# Patient Record
Sex: Female | Born: 2000 | Race: Black or African American | Hispanic: No | Smoking: Never smoker
Health system: Southern US, Community
[De-identification: ages and names within clinical notes are randomized; demographics above are authoritative.]

## PROBLEM LIST (undated history)

## (undated) HISTORY — PX: CHOLECYSTECTOMY: SHX55

---

## 2021-04-21 ENCOUNTER — Emergency Department: Payer: Self-pay

## 2021-04-21 ENCOUNTER — Encounter: Payer: Self-pay | Admitting: Emergency Medicine

## 2021-04-21 ENCOUNTER — Other Ambulatory Visit: Payer: Self-pay

## 2021-04-21 ENCOUNTER — Emergency Department
Admission: EM | Admit: 2021-04-21 | Discharge: 2021-04-21 | Disposition: A | Discharge status: Home / Self Care | Payer: Self-pay | Attending: Emergency Medicine | Admitting: Emergency Medicine

## 2021-04-21 DIAGNOSIS — R1010 Upper abdominal pain, unspecified: Secondary | ICD-10-CM

## 2021-04-21 DIAGNOSIS — R112 Nausea with vomiting, unspecified: Secondary | ICD-10-CM | POA: Insufficient documentation

## 2021-04-21 DIAGNOSIS — F1090 Alcohol use, unspecified, uncomplicated: Secondary | ICD-10-CM | POA: Insufficient documentation

## 2021-04-21 DIAGNOSIS — R1011 Right upper quadrant pain: Secondary | ICD-10-CM | POA: Insufficient documentation

## 2021-04-21 DIAGNOSIS — R1013 Epigastric pain: Secondary | ICD-10-CM | POA: Insufficient documentation

## 2021-04-21 DIAGNOSIS — K2970 Gastritis, unspecified, without bleeding: Secondary | ICD-10-CM

## 2021-04-21 DIAGNOSIS — R1012 Left upper quadrant pain: Secondary | ICD-10-CM | POA: Insufficient documentation

## 2021-04-21 LAB — URINALYSIS, ROUTINE W REFLEX MICROSCOPIC
Bilirubin Urine: NEGATIVE
Glucose, UA: NEGATIVE mg/dL
Hgb urine dipstick: NEGATIVE
Ketones, ur: 80 mg/dL — AB
Leukocytes,Ua: NEGATIVE
Nitrite: NEGATIVE
Protein, ur: NEGATIVE mg/dL
Specific Gravity, Urine: 1.027 (ref 1.005–1.030)
pH: 5 (ref 5.0–8.0)

## 2021-04-21 LAB — CBC
HCT: 39.8 % (ref 36.0–46.0)
Hemoglobin: 12.3 g/dL (ref 12.0–15.0)
MCH: 21 pg — ABNORMAL LOW (ref 26.0–34.0)
MCHC: 30.9 g/dL (ref 30.0–36.0)
MCV: 67.8 fL — ABNORMAL LOW (ref 80.0–100.0)
Platelets: 385 10*3/uL (ref 150–400)
RBC: 5.87 MIL/uL — ABNORMAL HIGH (ref 3.87–5.11)
RDW: 14.9 % (ref 11.5–15.5)
WBC: 8.7 10*3/uL (ref 4.0–10.5)
nRBC: 0.2 % (ref 0.0–0.2)

## 2021-04-21 LAB — COMPREHENSIVE METABOLIC PANEL
ALT: 21 U/L (ref 0–44)
AST: 26 U/L (ref 15–41)
Albumin: 4.6 g/dL (ref 3.5–5.0)
Alkaline Phosphatase: 73 U/L (ref 38–126)
Anion gap: 10 (ref 5–15)
BUN: 7 mg/dL (ref 6–20)
CO2: 26 mmol/L (ref 22–32)
Calcium: 9.7 mg/dL (ref 8.9–10.3)
Chloride: 102 mmol/L (ref 98–111)
Creatinine, Ser: 0.72 mg/dL (ref 0.44–1.00)
GFR, Estimated: >60 mL/min (ref >60)
Glucose, Bld: 122 mg/dL — ABNORMAL HIGH (ref 70–99)
Potassium: 3.8 mmol/L (ref 3.5–5.1)
Sodium: 138 mmol/L (ref 135–145)
Total Bilirubin: 0.4 mg/dL (ref 0.3–1.2)
Total Protein: 8.3 g/dL — ABNORMAL HIGH (ref 6.5–8.1)

## 2021-04-21 LAB — LIPASE, BLOOD: Lipase: 25 U/L (ref 11–51)

## 2021-04-21 LAB — HCG, QUANTITATIVE, PREGNANCY: hCG, Beta Chain, Quant, S: <1 m[IU]/mL (ref <5)

## 2021-04-21 LAB — POC URINE PREG, ED: Preg Test, Ur: NEGATIVE

## 2021-04-21 MED ORDER — ALUM & MAG HYDROXIDE-SIMETH 200-200-20 MG/5ML PO SUSP
30.0000 mL | Freq: Once | ORAL | Status: AC
Start: 2021-04-21 — End: 2021-04-21
  Administered 2021-04-21: 30 mL via ORAL
  Filled 2021-04-21: qty 30

## 2021-04-21 MED ORDER — ONDANSETRON HCL 4 MG/2ML IJ SOLN
4.0000 mg | Freq: Once | INTRAMUSCULAR | Status: AC
Start: 2021-04-21 — End: 2021-04-21
  Administered 2021-04-21: 4 mg via INTRAVENOUS
  Filled 2021-04-21: qty 2

## 2021-04-21 MED ORDER — ONDANSETRON 4 MG PO TBDP: 4.0000 mg | ORAL_TABLET | Freq: Three times a day (TID) | ORAL | 0 refills | Status: AC | PRN

## 2021-04-21 MED ORDER — LIDOCAINE VISCOUS HCL 2 % MT SOLN
15.0000 mL | Freq: Once | OROMUCOSAL | Status: AC
Start: 2021-04-21 — End: 2021-04-21
  Administered 2021-04-21: 15 mL via ORAL
  Filled 2021-04-21: qty 15

## 2021-04-21 MED ORDER — HYDROMORPHONE HCL 1 MG/ML IJ SOLN
0.5000 mg | Freq: Once | INTRAMUSCULAR | Status: AC
  Administered 2021-04-21: 0.5 mg via INTRAVENOUS
  Filled 2021-04-21: qty 1

## 2021-04-21 MED ORDER — IOHEXOL 300 MG/ML  SOLN
100.0000 mL | Freq: Once | INTRAMUSCULAR | Status: AC | PRN
Start: 2021-04-21 — End: 2021-04-21
  Administered 2021-04-21: 100 mL via INTRAVENOUS
  Filled 2021-04-21: qty 100

## 2021-04-21 MED ORDER — HYDROMORPHONE HCL 1 MG/ML IJ SOLN
1.0000 mg | Freq: Once | INTRAMUSCULAR | Status: AC
  Administered 2021-04-21: 1 mg via INTRAVENOUS
  Filled 2021-04-21: qty 1

## 2021-04-21 MED ORDER — DROPERIDOL 2.5 MG/ML IJ SOLN
1.2500 mg | Freq: Once | INTRAMUSCULAR | Status: AC
  Administered 2021-04-21: 1.25 mg via INTRAVENOUS
  Filled 2021-04-21: qty 2

## 2021-04-21 MED ORDER — PANTOPRAZOLE SODIUM 40 MG PO TBEC: 40.0000 mg | DELAYED_RELEASE_TABLET | Freq: Every day | ORAL | 0 refills | Status: DC

## 2021-04-21 MED ORDER — PANTOPRAZOLE SODIUM 40 MG IV SOLR
40.0000 mg | Freq: Once | INTRAVENOUS | Status: AC
Start: 2021-04-21 — End: 2021-04-21
  Administered 2021-04-21: 40 mg via INTRAVENOUS
  Filled 2021-04-21: qty 10

## 2021-04-21 MED ORDER — MORPHINE SULFATE (PF) 4 MG/ML IV SOLN
4.0000 mg | Freq: Once | INTRAVENOUS | Status: AC
  Administered 2021-04-21: 4 mg via INTRAVENOUS
  Filled 2021-04-21: qty 1

## 2021-04-21 MED ORDER — SUCRALFATE 1 G PO TABS: 1.0000 g | ORAL_TABLET | Freq: Three times a day (TID) | ORAL | 0 refills | Status: DC

## 2021-04-21 NOTE — ED Notes (Signed)
Pt leaving for CT.  

## 2021-04-21 NOTE — ED Notes (Signed)
Pt up to restroom; pt attempting to provide urine sample. Pt steady to restroom. Pt requesting pain meds again; provider [facility] notified via secure chat.  ?

## 2021-04-21 NOTE — ED Notes (Signed)
EDP Funke to bedside.  ?

## 2021-04-21 NOTE — ED Provider Notes (Signed)
6:08 PM There is no evidence of intestinal obstruction or pneumoperitoneum. ?There is no hydronephrosis. ?  ?There is mild diffuse wall thickening in the right colon which may ?be due to incomplete distention or suggest nonspecific colitis. ?  ?There are few small calcific densities in gallbladder suggesting ?gallbladder stones and possibly calcification in the wall of ?gallbladder. ?  ?Small amount of free fluid in the pelvis may suggest recent rupture ?of ovarian cyst or follicle. ?  ?Other findings as described in the body of the report. ? ? ?I did personally review the CT and see the gallbladder remnant.  ? ?Personally reviewed the patient she still complaining of severe pain.  Seems more upper abdomen we will add an ultrasound to evaluate her gallbladder although she is adamant that she had her gallbladder removed in [place] in 2016.  She states that when she was pregnant they thought that that she had a gallbladder as well but that they found out it was just a remnant.  She really does not have any lower abdominal pain but given the concern for cyst and free fluid will get ultrasound to further evaluate to see if she could be having referred pain from her ovaries. ? ?Her pelvic ultrasound was normal without evidence of torsion.  Her ultrasound of her right upper quadrant shows prior cholecystectomy with a 1.2 cm gallstone and a prominent remnant cystic duct.  I discussed the case with Dr. Maia Plan from surgery and he states that this should not be causing her the pain unless it was to move to her CBD GI also would not be able to do any kind of ERCP to remove it.  If her gallbladder is already removed should not become tracting against the cystic duct and there should be not be any obstruction so they sometimes just leave the stones in the ducts. ? ?9:15 PM  ? ?Reevaluated patient.  Urine preg was negative.  Urine with some ketones but is not tolerating some water.  Patient feeling much better at this time.  She  feels comfortable with discharge home with PPI, Carafate and will follow-up with GI given I suspect gastritis.  She expressed understanding felt comfortable with that plan ? ? ? ?  ?Concha Se, MD ?04/21/21 2320 ? ?

## 2021-04-21 NOTE — ED Notes (Signed)
Pt reports pain unchanged since dilaudid given about 40 minutes ago. Pt requesting to try something else. Visitor remains at bedside. EDP Siadecki notified via secure chat. ?

## 2021-04-21 NOTE — ED Notes (Signed)
Pt leaving for US.

## 2021-04-21 NOTE — ED Triage Notes (Signed)
Pt states that she has been having pain that wraps around her mid section bilat, pt states initially her pain reminded her of her gallbladder when she had it removed, but states that it is much worse now, reports some nausea and vomiting ?

## 2021-04-21 NOTE — ED Notes (Signed)
Urine preg NEG. 

## 2021-04-21 NOTE — ED Notes (Signed)
Pain meds given   family with pt.  Pt alert.   ?

## 2021-04-21 NOTE — ED Notes (Signed)
Pt sitting calmly on stretcher; pt's body language relaxed; face relaxed; pt currently reports 7/10 pain; states "my pain level is going down".  ?

## 2021-04-21 NOTE — ED Notes (Addendum)
See triage note. Pt is currently grimacing and pacing around room while bending over at waste with hands pressed into abdomen. Pt reports hasn't been able to keep down fluids or food in about 3 days. Very little urination and no BM in about 3 days per pt. Pt's resp reg/unlabored; skin dry. Pt reminded urine sample needed when she is able; pt has urine specimen cup.  ?

## 2021-04-21 NOTE — ED Provider Notes (Signed)
? ?[facility] ?Provider Note ? ? ? Event Date/Time  ? First MD Initiated Contact with Patient 04/21/21 1252   ?  (approximate) ? ? ?History  ? ?Abdominal Pain, Nausea, and Emesis ? ? ?HPI ? ?Darlene Nguyen is a 21 y.o. female status post cholecystectomy who presents with bilateral upper abdominal pain over the last few days, persistent course, but coming in more severe waves, not precipitated by eating or drinking.  It is associated with nausea and vomiting but no diarrhea.  The patient denies any vaginal bleeding or discharge and has slightly dark urine but no other urinary symptoms.  She states the pain feels somewhat similar to when she had gallbladder attacks previously, but is radiating to her back.  She reports some alcohol use but states she has not been drinking heavily anytime recently. ? ? ? ?Physical Exam  ? ?Triage Vital Signs: ?ED Triage Vitals  ?Enc Vitals Group  ?   BP 04/21/21 1222 138/75  ?   Pulse Rate 04/21/21 1222 69  ?   Resp 04/21/21 1222 16  ?   Temp 04/21/21 1222 98.7 ?F (37.1 ?C)  ?   Temp Source 04/21/21 1222 Oral  ?   SpO2 04/21/21 1222 94 %  ?   Weight 04/21/21 1225 188 lb (85.3 kg)  ?   Height 04/21/21 1225 5\' 1"  (1.549 m)  ?   Head Circumference --   ?   Peak Flow --   ?   Pain Score 04/21/21 1224 10  ?   Pain Loc --   ?   Pain Edu? --   ?   Excl. in GC? --   ? ? ?Most recent vital signs: ?Vitals:  ? 04/21/21 1222  ?BP: 138/75  ?Pulse: 69  ?Resp: 16  ?Temp: 98.7 ?F (37.1 ?C)  ?SpO2: 94%  ? ? ? ?General: Alert and oriented, uncomfortable appearing but in no acute distress. ?CV:  Good peripheral perfusion.  ?Resp:  Normal effort.  ?Abd:  Soft with moderate epigastric tenderness.  No distention.  ?Other:  No scleral icterus.  Moist mucous membranes. ? ? ?ED Results / Procedures / Treatments  ? ?Labs ?(all labs ordered are listed, but only abnormal results are displayed) ?Labs Reviewed  ?COMPREHENSIVE METABOLIC PANEL - Abnormal; Notable for the following components:  ?     Result Value  ? Glucose, Bld 122 (*)   ? Total Protein 8.3 (*)   ? All other components within normal limits  ?CBC - Abnormal; Notable for the following components:  ? RBC 5.87 (*)   ? MCV 67.8 (*)   ? MCH 21.0 (*)   ? All other components within normal limits  ?LIPASE, BLOOD  ?URINALYSIS, ROUTINE W REFLEX MICROSCOPIC  ?POC URINE PREG, ED  ? ? ? ?EKG ? ? ? ?RADIOLOGY ? ?CT abdomen/pelvis: Pending ? ?PROCEDURES: ? ?Critical Care performed: No ? ?Procedures ? ? ?MEDICATIONS ORDERED IN ED: ?Medications  ?HYDROmorphone (DILAUDID) injection 1 mg (1 mg Intravenous Given 04/21/21 1332)  ?ondansetron (ZOFRAN) injection 4 mg (4 mg Intravenous Given 04/21/21 1332)  ?HYDROmorphone (DILAUDID) injection 0.5 mg (0.5 mg Intravenous Given 04/21/21 1433)  ? ? ? ?IMPRESSION / MDM / ASSESSMENT AND PLAN / ED COURSE  ?I reviewed the triage vital signs and the nursing notes. ? ?21 year old female with PMH as noted above presents with bilateral upper abdominal pain over the last few days associated with nausea and vomiting. ? ?On initial exam the patient was very uncomfortable  appearing but in no acute distress.  The abdomen is soft with some epigastric tenderness but no other acute findings.  After receiving a dose of Dilaudid the patient appears much more comfortable. ? ?Differential includes gastritis, gastroparesis, pancreatitis, colitis, gastroenteritis, ureteral stone.  I have a relatively lower suspicion for choledocholithiasis or other hepatobiliary cause.  Given that the pain is only involving her upper abdomen I do not suspect ruptured ovarian cyst, torsion, or other gynecologic etiology. ? ?We will obtain lab work-up, CT abdomen/pelvis, and reassess. ? ?----------------------------------------- ?3:06 PM on 04/21/2021 ?----------------------------------------- ? ?Lab work-up is reassuring.  There is no leukocytosis.  LFTs and lipase are normal.  I have signed the patient out to the oncoming ED physician Dr. Fuller Plan who will  follow-up on the CT. ? ?FINAL CLINICAL IMPRESSION(S) / ED DIAGNOSES  ? ?Final diagnoses:  ?Upper abdominal pain  ? ? ? ?Rx / DC Orders  ? ?ED Discharge Orders   ? ? None  ? ?  ? ? ? ?Note:  This document was prepared using Dragon voice recognition software and may include unintentional dictation errors.  ?  Dionne Bucy, MD ?04/21/21 1506 ? ?

## 2021-04-21 NOTE — ED Notes (Signed)
EDP Siadecki notified via secure chat of pt's extreme pain.  ?

## 2021-04-21 NOTE — Discharge Instructions (Signed)
Your work-up was reassuring is that this could be some gastritis however you need to call the GI team for follow-up and return to the ER if symptoms are worsening develop fevers or any other concern ?

## 2022-01-20 ENCOUNTER — Other Ambulatory Visit: Payer: Self-pay

## 2022-01-20 ENCOUNTER — Emergency Department
Admission: EM | Admit: 2022-01-20 | Discharge: 2022-01-20 | Disposition: A | Discharge status: Home / Self Care | Payer: Medicaid Other | Attending: Emergency Medicine | Admitting: Emergency Medicine

## 2022-01-20 ENCOUNTER — Emergency Department: Payer: Medicaid Other

## 2022-01-20 DIAGNOSIS — S0990XA Unspecified injury of head, initial encounter: Secondary | ICD-10-CM | POA: Insufficient documentation

## 2022-01-20 DIAGNOSIS — Y92410 Unspecified street and highway as the place of occurrence of the external cause: Secondary | ICD-10-CM | POA: Insufficient documentation

## 2022-01-20 MED ORDER — MELOXICAM 15 MG PO TABS: 15.0000 mg | ORAL_TABLET | Freq: Every day | ORAL | 1 refills | Status: AC

## 2022-01-20 MED ORDER — MELOXICAM 15 MG PO TABS: 15.0000 mg | ORAL_TABLET | Freq: Every day | ORAL | Status: AC

## 2022-01-20 MED ORDER — MELOXICAM 15 MG PO TABS: 15.0000 mg | ORAL_TABLET | Freq: Every day | ORAL | Status: DC

## 2022-01-20 MED ORDER — METHOCARBAMOL 500 MG PO TABS: 500.0000 mg | ORAL_TABLET | Freq: Three times a day (TID) | ORAL | 0 refills | Status: AC | PRN
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
## 2022-02-03 ENCOUNTER — Encounter: Payer: Self-pay | Admitting: Emergency Medicine

## 2022-02-03 ENCOUNTER — Other Ambulatory Visit: Payer: Self-pay

## 2022-02-03 DIAGNOSIS — E876 Hypokalemia: Secondary | ICD-10-CM | POA: Insufficient documentation

## 2022-02-03 DIAGNOSIS — R739 Hyperglycemia, unspecified: Secondary | ICD-10-CM | POA: Insufficient documentation

## 2022-02-03 DIAGNOSIS — D509 Iron deficiency anemia, unspecified: Secondary | ICD-10-CM | POA: Insufficient documentation

## 2022-02-03 DIAGNOSIS — F12188 Cannabis abuse with other cannabis-induced disorder: Secondary | ICD-10-CM | POA: Diagnosis not present

## 2022-02-03 DIAGNOSIS — K838 Other specified diseases of biliary tract: Secondary | ICD-10-CM | POA: Diagnosis not present

## 2022-02-03 DIAGNOSIS — K8020 Calculus of gallbladder without cholecystitis without obstruction: Secondary | ICD-10-CM | POA: Diagnosis not present

## 2022-02-03 DIAGNOSIS — R112 Nausea with vomiting, unspecified: Principal | ICD-10-CM | POA: Insufficient documentation

## 2022-02-03 DIAGNOSIS — M549 Dorsalgia, unspecified: Secondary | ICD-10-CM | POA: Diagnosis not present

## 2022-02-03 DIAGNOSIS — R931 Abnormal findings on diagnostic imaging of heart and coronary circulation: Secondary | ICD-10-CM | POA: Diagnosis not present

## 2022-02-03 DIAGNOSIS — R1013 Epigastric pain: Secondary | ICD-10-CM | POA: Diagnosis not present

## 2022-02-03 NOTE — ED Triage Notes (Addendum)
Pt arrived via POV with reports of upper abd pain epigastric region and back pain that started this evening around 7pm.  Pt reports vomiting denies any diarrhea.  Pt describes the pain as sharp and spasms.  PT states she does not have gall bladder was removed 5 years ago.

## 2022-02-04 ENCOUNTER — Observation Stay
Admission: EM | Admit: 2022-02-04 | Discharge: 2022-02-05 | Disposition: A | Discharge status: Home / Self Care | Payer: Medicaid Other | Attending: Internal Medicine | Admitting: Internal Medicine

## 2022-02-04 ENCOUNTER — Encounter: Payer: Self-pay | Admitting: Internal Medicine

## 2022-02-04 ENCOUNTER — Emergency Department: Payer: Medicaid Other

## 2022-02-04 DIAGNOSIS — R1013 Epigastric pain: Secondary | ICD-10-CM

## 2022-02-04 DIAGNOSIS — K8020 Calculus of gallbladder without cholecystitis without obstruction: Secondary | ICD-10-CM | POA: Diagnosis not present

## 2022-02-04 DIAGNOSIS — F1210 Cannabis abuse, uncomplicated: Secondary | ICD-10-CM | POA: Diagnosis present

## 2022-02-04 DIAGNOSIS — K838 Other specified diseases of biliary tract: Secondary | ICD-10-CM | POA: Diagnosis not present

## 2022-02-04 DIAGNOSIS — E669 Obesity, unspecified: Secondary | ICD-10-CM | POA: Diagnosis present

## 2022-02-04 DIAGNOSIS — D509 Iron deficiency anemia, unspecified: Secondary | ICD-10-CM | POA: Diagnosis present

## 2022-02-04 DIAGNOSIS — E66811 Obesity, class 1: Secondary | ICD-10-CM | POA: Diagnosis present

## 2022-02-04 DIAGNOSIS — R112 Nausea with vomiting, unspecified: Secondary | ICD-10-CM

## 2022-02-04 DIAGNOSIS — E876 Hypokalemia: Secondary | ICD-10-CM | POA: Diagnosis present

## 2022-02-04 DIAGNOSIS — R1116 Cannabis hyperemesis syndrome: Secondary | ICD-10-CM

## 2022-02-04 DIAGNOSIS — R739 Hyperglycemia, unspecified: Secondary | ICD-10-CM | POA: Diagnosis present

## 2022-02-04 DIAGNOSIS — R1110 Vomiting, unspecified: Secondary | ICD-10-CM | POA: Diagnosis present

## 2022-02-04 LAB — URINE DRUG SCREEN, QUALITATIVE (ARMC ONLY)
Amphetamines, Ur Screen: NOT DETECTED
Barbiturates, Ur Screen: NOT DETECTED
Benzodiazepine, Ur Scrn: NOT DETECTED
Cannabinoid 50 Ng, Ur ~~LOC~~: POSITIVE — AB
Cocaine Metabolite,Ur ~~LOC~~: NOT DETECTED
MDMA (Ecstasy)Ur Screen: NOT DETECTED
Methadone Scn, Ur: NOT DETECTED
Opiate, Ur Screen: POSITIVE — AB
Phencyclidine (PCP) Ur S: NOT DETECTED
Tricyclic, Ur Screen: NOT DETECTED

## 2022-02-04 LAB — COMPREHENSIVE METABOLIC PANEL
ALT: 22 U/L (ref 0–44)
AST: 28 U/L (ref 15–41)
Albumin: 4.5 g/dL (ref 3.5–5.0)
Alkaline Phosphatase: 70 U/L (ref 38–126)
Anion gap: 10 (ref 5–15)
BUN: 10 mg/dL (ref 6–20)
CO2: 24 mmol/L (ref 22–32)
Calcium: 8.9 mg/dL (ref 8.9–10.3)
Chloride: 102 mmol/L (ref 98–111)
Creatinine, Ser: 0.71 mg/dL (ref 0.44–1.00)
GFR, Estimated: >60 mL/min (ref >60)
Glucose, Bld: 154 mg/dL — ABNORMAL HIGH (ref 70–99)
Potassium: 3 mmol/L — ABNORMAL LOW (ref 3.5–5.1)
Sodium: 136 mmol/L (ref 135–145)
Total Bilirubin: 0.6 mg/dL (ref 0.3–1.2)
Total Protein: 8.1 g/dL (ref 6.5–8.1)

## 2022-02-04 LAB — CBC
HCT: 36 % (ref 36.0–46.0)
Hemoglobin: 11.2 g/dL — ABNORMAL LOW (ref 12.0–15.0)
MCH: 21.3 pg — ABNORMAL LOW (ref 26.0–34.0)
MCHC: 31.1 g/dL (ref 30.0–36.0)
MCV: 68.4 fL — ABNORMAL LOW (ref 80.0–100.0)
Platelets: 377 10*3/uL (ref 150–400)
RBC: 5.26 MIL/uL — ABNORMAL HIGH (ref 3.87–5.11)
RDW: 14.4 % (ref 11.5–15.5)
WBC: 13.5 10*3/uL — ABNORMAL HIGH (ref 4.0–10.5)
nRBC: 0 % (ref 0.0–0.2)

## 2022-02-04 LAB — LIPASE, BLOOD: Lipase: 26 U/L (ref 11–51)

## 2022-02-04 LAB — HCG, QUANTITATIVE, PREGNANCY: hCG, Beta Chain, Quant, S: <1 m[IU]/mL (ref <5)

## 2022-02-04 LAB — TROPONIN I (HIGH SENSITIVITY): Troponin I (High Sensitivity): <2 ng/L (ref <18)

## 2022-02-04 MED ORDER — HYDROMORPHONE HCL 1 MG/ML IJ SOLN: 0.5000 mg | INTRAMUSCULAR | Status: DC | PRN

## 2022-02-04 MED ORDER — METOCLOPRAMIDE HCL 5 MG/ML IJ SOLN
10.0000 mg | Freq: Four times a day (QID) | INTRAMUSCULAR | Status: DC
  Administered 2022-02-04 – 2022-02-05 (×4): 10 mg via INTRAVENOUS
  Filled 2022-02-04 (×4): qty 2

## 2022-02-04 MED ORDER — SODIUM CHLORIDE 0.9 % IV BOLUS
1000.0000 mL | Freq: Once | INTRAVENOUS | Status: AC
  Administered 2022-02-04: 1000 mL via INTRAVENOUS

## 2022-02-04 MED ORDER — HALOPERIDOL LACTATE 5 MG/ML IJ SOLN
5.0000 mg | Freq: Once | INTRAMUSCULAR | Status: AC
  Administered 2022-02-04: 5 mg via INTRAVENOUS
  Filled 2022-02-04: qty 1

## 2022-02-04 MED ORDER — ONDANSETRON HCL 4 MG/2ML IJ SOLN
4.0000 mg | Freq: Once | INTRAMUSCULAR | Status: AC | PRN
  Administered 2022-02-04: 4 mg via INTRAVENOUS
  Filled 2022-02-04: qty 2

## 2022-02-04 MED ORDER — HYDROMORPHONE HCL 1 MG/ML IJ SOLN
1.0000 mg | Freq: Once | INTRAMUSCULAR | Status: AC
  Administered 2022-02-04: 1 mg via INTRAVENOUS
  Filled 2022-02-04: qty 1

## 2022-02-04 MED ORDER — PANTOPRAZOLE SODIUM 40 MG IV SOLR
40.0000 mg | INTRAVENOUS | Status: DC
  Administered 2022-02-05: 40 mg via INTRAVENOUS
  Filled 2022-02-04 (×2): qty 10

## 2022-02-04 MED ORDER — PANTOPRAZOLE SODIUM 40 MG IV SOLR
40.0000 mg | Freq: Once | INTRAVENOUS | Status: AC
  Administered 2022-02-04: 40 mg via INTRAVENOUS
  Filled 2022-02-04: qty 10

## 2022-02-04 MED ORDER — POTASSIUM CHLORIDE IN NACL 20-0.9 MEQ/L-% IV SOLN
INTRAVENOUS | Status: AC
  Filled 2022-02-04 (×4): qty 1000

## 2022-02-04 MED ORDER — ONDANSETRON HCL 4 MG PO TABS: 4.0000 mg | ORAL_TABLET | Freq: Four times a day (QID) | ORAL | Status: DC | PRN

## 2022-02-04 MED ORDER — METOCLOPRAMIDE HCL 5 MG/ML IJ SOLN: 10.0000 mg | Freq: Four times a day (QID) | INTRAMUSCULAR | Status: DC | PRN

## 2022-02-04 MED ORDER — MORPHINE SULFATE (PF) 4 MG/ML IV SOLN
4.0000 mg | INTRAVENOUS | Status: DC | PRN
  Administered 2022-02-04 – 2022-02-05 (×5): 4 mg via INTRAVENOUS
  Filled 2022-02-04 (×5): qty 1

## 2022-02-04 MED ORDER — CAPSAICIN 0.025 % EX CREA
TOPICAL_CREAM | Freq: Once | CUTANEOUS | Status: AC
  Filled 2022-02-04: qty 60

## 2022-02-04 MED ORDER — FAMOTIDINE IN NACL 20-0.9 MG/50ML-% IV SOLN
20.0000 mg | Freq: Once | INTRAVENOUS | Status: AC
  Administered 2022-02-04: 20 mg via INTRAVENOUS
  Filled 2022-02-04: qty 50

## 2022-02-04 MED ORDER — POTASSIUM CHLORIDE IN NACL 40-0.9 MEQ/L-% IV SOLN
INTRAVENOUS | Status: DC
  Filled 2022-02-04: qty 1000

## 2022-02-04 MED ORDER — ONDANSETRON HCL 4 MG/2ML IJ SOLN: 4.0000 mg | Freq: Four times a day (QID) | INTRAMUSCULAR | Status: DC | PRN

## 2022-02-04 MED ORDER — ONDANSETRON HCL 4 MG/2ML IJ SOLN: 4.0000 mg | Freq: Three times a day (TID) | INTRAMUSCULAR | Status: DC | PRN

## 2022-02-04 MED ORDER — METOCLOPRAMIDE HCL 5 MG/ML IJ SOLN
5.0000 mg | Freq: Once | INTRAMUSCULAR | Status: AC
  Administered 2022-02-04: 5 mg via INTRAVENOUS
  Filled 2022-02-04: qty 2

## 2022-02-04 MED ORDER — ACETAMINOPHEN 325 MG PO TABS
650.0000 mg | ORAL_TABLET | Freq: Four times a day (QID) | ORAL | Status: DC | PRN
  Administered 2022-02-05: 650 mg via ORAL
  Filled 2022-02-04: qty 2

## 2022-02-04 MED ORDER — ACETAMINOPHEN 650 MG RE SUPP: 650.0000 mg | Freq: Four times a day (QID) | RECTAL | Status: DC | PRN

## 2022-02-04 MED ORDER — MAGNESIUM SULFATE 2 GM/50ML IV SOLN
2.0000 g | Freq: Once | INTRAVENOUS | Status: AC
  Administered 2022-02-04: 2 g via INTRAVENOUS
  Filled 2022-02-04: qty 50

## 2022-02-04 MED ORDER — IOHEXOL 300 MG/ML  SOLN
100.0000 mL | Freq: Once | INTRAMUSCULAR | Status: AC | PRN
  Administered 2022-02-04: 100 mL via INTRAVENOUS

## 2022-02-04 MED ORDER — DROPERIDOL 2.5 MG/ML IJ SOLN
2.5000 mg | Freq: Once | INTRAMUSCULAR | Status: AC
  Administered 2022-02-04: 2.5 mg via INTRAVENOUS
  Filled 2022-02-04: qty 2

## 2022-02-04 NOTE — ED Notes (Signed)
Called for transport to room.  
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
## 2022-02-04 NOTE — H&P (Signed)
History and Physical    Patient: Darlene Nguyen QAS:[id number] DOB: 06-03-2000 DOA: 02/04/2022 DOS: the patient was seen and examined on 02/04/2022 PCP: Pcp, No  Patient coming from: Home  Chief Complaint:  Chief Complaint  Patient presents with   Abdominal Pain   Back Pain   HPI: Darlene Nguyen is a 22 y.o. female with medical history significant of class I obesity, cannabis use, history of cholecystectomy with residual choledocholithiasis who presented to the emergency department with abdominal and back pain associated with numerous episodes of emesis.  She smokes alcohol "a joint" of cannabis daily.   No  diarrhea, constipation, melena or hematochezia.  No flank pain, dysuria, frequency or hematuria.No fever, chills or night sweats. No sore throat, rhinorrhea, dyspnea, wheezing or hemoptysis.  No chest pain, palpitations, diaphoresis, PND, orthopnea or pitting edema of the lower extremities.   No polyuria, polydipsia, polyphagia or blurred vision.  ED course: Initial vital signs were temperature 98.4 F, pulse 100, respirations 20, BP 118/62 mmHg O2 sat 96% on room air.  The patient received 1000 mL of normal saline bolus, Protonix 40 mg IVP, ondansetron 4 mg IVP, metoclopramide 5 mg IVP hydromorphone 1 mg IVP, hydromorphone 0.5 mg IVP, haloperidol 5 mg IVP, droperidol 2.5 mg IVP and Capsaisin cream applied to the umbilicus x 1.  Lab work: CBC showed a white count of 13.5, hemoglobin 11.2 g/dL with an MCV of 68.4 fL and platelets 377.  Lipase, troponin and hCG, beta chain normal.  CMP showed a potassium 3.0 mmol/L and glucose of 154 mg/dL.  Imaging: CT abdomen cholelithiasis with no CT finding of acute cholecystitis.  [place] RUQ there is a 48 structure noted in the region of the gallbladder.  With up to 12 mm stone within.  These changes correspond to the seen on recent CT examination.  Given prior cholecystectomy this likely represents some residual dilatation of the cystic duct.  No CBD dilatation.   No other focal abnormality.   Review of Systems: As mentioned in the history of present illness. All other systems reviewed and are negative. No past medical history on file. Past Surgical History:  Procedure Laterality Date   CHOLECYSTECTOMY     Social History:  reports that she has never smoked. She has never used smokeless tobacco. She reports that she does not currently use alcohol. She reports current drug use. Drug: Marijuana.  No Known Allergies  Denied family history on first-degree 78 and extended family members.  Prior to Admission medications   Medication Sig Start Date End Date Taking? Authorizing Provider  pantoprazole (PROTONIX) 40 MG tablet Take 1 tablet (40 mg total) by mouth daily for 14 days. 04/21/21 05/05/21  Vanessa [place], MD  sucralfate (CARAFATE) 1 g tablet Take 1 tablet (1 g total) by mouth 4 (four) times daily -  with meals and at bedtime for 14 days. 04/21/21 05/05/21  Vanessa [place], MD    Physical Exam: Vitals:   02/04/22 0053 02/04/22 0314 02/04/22 0330 02/04/22 0548  BP: 99/75 133/78 135/83   Pulse: 84 (!) 57 (!) 59   Resp: 20 12    Temp:    97.6 F (36.4 C)  TempSrc:    Oral  SpO2: 99% 98% 95%   Weight:      Height:       Physical Exam Vitals and nursing note reviewed.  Constitutional:      General: She is not in acute distress.    Appearance: She is obese.  HENT:  Head: Normocephalic.     Nose: No rhinorrhea.     Mouth/Throat:     Mouth: Mucous membranes are dry.  Eyes:     General: No scleral icterus.    Pupils: Pupils are equal, round, and reactive to light.  Neck:     Vascular: No JVD.  Cardiovascular:     Rate and Rhythm: Normal rate and regular rhythm.     Heart sounds: S1 normal and S2 normal.  Pulmonary:     Effort: Pulmonary effort is normal.  Abdominal:     General: Abdomen is protuberant. Bowel sounds are normal.     Palpations: Abdomen is soft.     Tenderness: There is abdominal tenderness in the epigastric area. There is  no right CVA tenderness, left CVA tenderness, guarding or rebound.  Musculoskeletal:     Cervical back: Neck supple.     Right lower leg: No edema.     Left lower leg: No edema.  Skin:    General: Skin is warm and dry.  Neurological:     General: No focal deficit present.     Mental Status: She is alert, oriented to person, place, and time and easily aroused.  Psychiatric:        Mood and Affect: Mood normal.        Behavior: Behavior normal.   Data Reviewed:  Results are pending, will review when available.  Assessment and Plan: Principal Problem:   Hyperemesis In the setting of:   Cannabis abuse Observation/MedSurg. Keep NPO for now. Trial of clear liquids once feeling better. Continue IV fluids. Analgesics as needed. Antiemetics as needed. Pantoprazole 40 mg IVP every 24 hours. Keep electrolytes optimized. Follow-up CBC and CMP in AM.  Active Problems:   Hypokalemia Replacing. Magnesium supplemented. Follow-up potassium level.    Microcytic anemia Likely due to menstrual losses. Might benefit from iron supplementation. Needs to establish with a primary care provider. Follow-up with GYN and a/or PCP as an outpatient    Hyperglycemia Check fasting glucose in AM.    Class I obesity Current BMI 33.07 kg/m. Lifestyle modifications.       Advance Care Planning:   Code Status: Full Code   Consults:   Family Communication:   Severity of Illness: The appropriate patient status for this patient is OBSERVATION. Observation status is judged to be reasonable and necessary in order to provide the required intensity of service to ensure the patient's safety. The patient's presenting symptoms, physical exam findings, and initial radiographic and laboratory data in the context of their medical condition is felt to place them at decreased risk for further clinical deterioration. Furthermore, it is anticipated that the patient will be medically stable for discharge  from the hospital within 2 midnights of admission.   Author: Reubin Milan, MD 02/04/2022 7:39 AM  For on call review [email or web address].   This document was prepared using Dragon voice recognition software and may contain some unintended transcription errors.

## 2022-02-04 NOTE — ED Notes (Signed)
Lab called to add HCG.

## 2022-02-04 NOTE — ED Notes (Signed)
Attempted IV access x2 both unsuccessful

## 2022-02-04 NOTE — ED Notes (Signed)
Pt unable to void 

## 2022-02-05 DIAGNOSIS — E876 Hypokalemia: Secondary | ICD-10-CM

## 2022-02-05 DIAGNOSIS — R1110 Vomiting, unspecified: Secondary | ICD-10-CM | POA: Diagnosis not present

## 2022-02-05 LAB — BASIC METABOLIC PANEL
Anion gap: 3 — ABNORMAL LOW (ref 5–15)
BUN: <5 mg/dL — ABNORMAL LOW (ref 6–20)
CO2: 25 mmol/L (ref 22–32)
Calcium: 8.7 mg/dL — ABNORMAL LOW (ref 8.9–10.3)
Chloride: 108 mmol/L (ref 98–111)
Creatinine, Ser: 0.64 mg/dL (ref 0.44–1.00)
GFR, Estimated: >60 mL/min (ref >60)
Glucose, Bld: 80 mg/dL (ref 70–99)
Potassium: 4.3 mmol/L (ref 3.5–5.1)
Sodium: 136 mmol/L (ref 135–145)

## 2022-02-05 LAB — CBC
HCT: 33.8 % — ABNORMAL LOW (ref 36.0–46.0)
Hemoglobin: 10.5 g/dL — ABNORMAL LOW (ref 12.0–15.0)
MCH: 21.3 pg — ABNORMAL LOW (ref 26.0–34.0)
MCHC: 31.1 g/dL (ref 30.0–36.0)
MCV: 68.7 fL — ABNORMAL LOW (ref 80.0–100.0)
Platelets: 320 10*3/uL (ref 150–400)
RBC: 4.92 MIL/uL (ref 3.87–5.11)
RDW: 14.7 % (ref 11.5–15.5)
WBC: 8.8 10*3/uL (ref 4.0–10.5)
nRBC: 0 % (ref 0.0–0.2)

## 2022-02-05 LAB — URINALYSIS, ROUTINE W REFLEX MICROSCOPIC
Bilirubin Urine: NEGATIVE
Glucose, UA: NEGATIVE mg/dL
Hgb urine dipstick: NEGATIVE
Ketones, ur: 20 mg/dL — AB
Leukocytes,Ua: NEGATIVE
Nitrite: NEGATIVE
Protein, ur: NEGATIVE mg/dL
Specific Gravity, Urine: 1.006 (ref 1.005–1.030)
pH: 7 (ref 5.0–8.0)

## 2022-02-05 LAB — PHOSPHORUS: Phosphorus: 3.5 mg/dL (ref 2.5–4.6)

## 2022-02-05 LAB — MAGNESIUM: Magnesium: 1.9 mg/dL (ref 1.7–2.4)

## 2022-02-05 LAB — HIV ANTIBODY (ROUTINE TESTING W REFLEX): HIV Screen 4th Generation wRfx: NONREACTIVE

## 2022-02-05 NOTE — Progress Notes (Signed)
Pt given all discharge instructions. PIV removed. Discharged home.
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
## 2023-01-19 ENCOUNTER — Other Ambulatory Visit: Payer: Self-pay

## 2023-01-19 ENCOUNTER — Emergency Department (HOSPITAL_COMMUNITY)
Admission: EM | Admit: 2023-01-19 | Discharge: 2023-01-19 | Disposition: A | Discharge status: Home / Self Care | Payer: 59 | Attending: Emergency Medicine | Admitting: Emergency Medicine

## 2023-01-19 ENCOUNTER — Encounter (HOSPITAL_COMMUNITY): Payer: Self-pay

## 2023-01-19 ENCOUNTER — Emergency Department (HOSPITAL_COMMUNITY): Payer: 59

## 2023-01-19 DIAGNOSIS — R1084 Generalized abdominal pain: Secondary | ICD-10-CM | POA: Diagnosis not present

## 2023-01-19 DIAGNOSIS — R1013 Epigastric pain: Secondary | ICD-10-CM | POA: Diagnosis not present

## 2023-01-19 DIAGNOSIS — R110 Nausea: Secondary | ICD-10-CM | POA: Insufficient documentation

## 2023-01-19 DIAGNOSIS — R109 Unspecified abdominal pain: Secondary | ICD-10-CM | POA: Diagnosis not present

## 2023-01-19 DIAGNOSIS — Z743 Need for continuous supervision: Secondary | ICD-10-CM | POA: Diagnosis not present

## 2023-01-19 DIAGNOSIS — R112 Nausea with vomiting, unspecified: Secondary | ICD-10-CM | POA: Diagnosis not present

## 2023-01-19 DIAGNOSIS — M549 Dorsalgia, unspecified: Secondary | ICD-10-CM | POA: Diagnosis not present

## 2023-01-19 LAB — COMPREHENSIVE METABOLIC PANEL
ALT: 22 U/L (ref 0–44)
AST: 29 U/L (ref 15–41)
Albumin: 4.7 g/dL (ref 3.5–5.0)
Alkaline Phosphatase: 61 U/L (ref 38–126)
Anion gap: 11 (ref 5–15)
BUN: 8 mg/dL (ref 6–20)
CO2: 23 mmol/L (ref 22–32)
Calcium: 9.4 mg/dL (ref 8.9–10.3)
Chloride: 103 mmol/L (ref 98–111)
Creatinine, Ser: 0.69 mg/dL (ref 0.44–1.00)
GFR, Estimated: >60 mL/min (ref >60)
Glucose, Bld: 118 mg/dL — ABNORMAL HIGH (ref 70–99)
Potassium: 3.4 mmol/L — ABNORMAL LOW (ref 3.5–5.1)
Sodium: 137 mmol/L (ref 135–145)
Total Bilirubin: 0.3 mg/dL (ref <1.2)
Total Protein: 8.2 g/dL — ABNORMAL HIGH (ref 6.5–8.1)

## 2023-01-19 LAB — HCG, SERUM, QUALITATIVE: Preg, Serum: NEGATIVE

## 2023-01-19 LAB — CBC
HCT: 36.7 % (ref 36.0–46.0)
Hemoglobin: 11.8 g/dL — ABNORMAL LOW (ref 12.0–15.0)
MCH: 22.3 pg — ABNORMAL LOW (ref 26.0–34.0)
MCHC: 32.2 g/dL (ref 30.0–36.0)
MCV: 69.2 fL — ABNORMAL LOW (ref 80.0–100.0)
Platelets: 370 10*3/uL (ref 150–400)
RBC: 5.3 MIL/uL — ABNORMAL HIGH (ref 3.87–5.11)
RDW: 15.1 % (ref 11.5–15.5)
WBC: 10.5 10*3/uL (ref 4.0–10.5)
nRBC: 0 % (ref 0.0–0.2)

## 2023-01-19 LAB — LIPASE, BLOOD: Lipase: 22 U/L (ref 11–51)

## 2023-01-19 MED ORDER — FENTANYL CITRATE PF 50 MCG/ML IJ SOSY
50.0000 ug | PREFILLED_SYRINGE | INTRAMUSCULAR | Status: DC | PRN
Start: 2023-01-19 — End: 2023-01-19
  Administered 2023-01-19: 50 ug via NASAL
  Filled 2023-01-19: qty 1

## 2023-01-19 MED ORDER — ONDANSETRON 4 MG PO TBDP: ORAL_TABLET | ORAL | 0 refills | Status: AC

## 2023-01-19 MED ORDER — PANTOPRAZOLE SODIUM 40 MG IV SOLR
40.0000 mg | Freq: Once | INTRAVENOUS | Status: DC
  Filled 2023-01-19: qty 10

## 2023-01-19 MED ORDER — HYDROMORPHONE HCL 1 MG/ML IJ SOLN
0.5000 mg | Freq: Once | INTRAMUSCULAR | Status: AC
  Administered 2023-01-19: 0.5 mg via INTRAVENOUS
  Filled 2023-01-19: qty 1

## 2023-01-19 MED ORDER — OXYCODONE-ACETAMINOPHEN 5-325 MG PO TABS: 1.0000 | ORAL_TABLET | Freq: Four times a day (QID) | ORAL | 0 refills | Status: AC | PRN

## 2023-01-19 MED ORDER — OXYCODONE-ACETAMINOPHEN 5-325 MG PO TABS
1.0000 | ORAL_TABLET | Freq: Once | ORAL | Status: AC
  Administered 2023-01-19: 1 via ORAL
  Filled 2023-01-19: qty 1

## 2023-01-19 MED ORDER — LORAZEPAM 2 MG/ML IJ SOLN
0.5000 mg | Freq: Once | INTRAMUSCULAR | Status: AC
  Administered 2023-01-19: 0.5 mg via INTRAVENOUS
  Filled 2023-01-19: qty 1

## 2023-01-19 MED ORDER — ALUM & MAG HYDROXIDE-SIMETH 200-200-20 MG/5ML PO SUSP
30.0000 mL | Freq: Once | ORAL | Status: AC
  Administered 2023-01-19: 30 mL via ORAL
  Filled 2023-01-19: qty 30

## 2023-01-19 MED ORDER — DICYCLOMINE HCL 10 MG PO CAPS
10.0000 mg | ORAL_CAPSULE | Freq: Once | ORAL | Status: AC
  Administered 2023-01-19: 10 mg via ORAL
  Filled 2023-01-19: qty 1

## 2023-01-19 MED ORDER — ONDANSETRON HCL 4 MG PO TABS: 4.0000 mg | ORAL_TABLET | Freq: Four times a day (QID) | ORAL | 0 refills | Status: AC

## 2023-01-19 MED ORDER — ONDANSETRON 4 MG PO TBDP
4.0000 mg | ORAL_TABLET | Freq: Once | ORAL | Status: AC
  Administered 2023-01-19: 4 mg via ORAL
  Filled 2023-01-19: qty 1

## 2023-01-19 MED ORDER — DROPERIDOL 2.5 MG/ML IJ SOLN
1.2500 mg | Freq: Once | INTRAMUSCULAR | Status: AC
  Administered 2023-01-19: 1.25 mg via INTRAVENOUS
  Filled 2023-01-19: qty 2

## 2023-01-19 MED ORDER — SUCRALFATE 1 G PO TABS
1.0000 g | ORAL_TABLET | Freq: Once | ORAL | Status: AC
  Administered 2023-01-19: 1 g via ORAL
  Filled 2023-01-19: qty 1

## 2023-01-19 MED ORDER — PROMETHAZINE HCL 25 MG RE SUPP: 25.0000 mg | Freq: Four times a day (QID) | RECTAL | 0 refills | Status: AC | PRN

## 2023-01-19 MED ORDER — ONDANSETRON HCL 4 MG/2ML IJ SOLN
4.0000 mg | Freq: Once | INTRAMUSCULAR | Status: DC
  Filled 2023-01-19: qty 2

## 2023-01-19 MED ORDER — PANTOPRAZOLE SODIUM 20 MG PO TBEC: 20.0000 mg | DELAYED_RELEASE_TABLET | Freq: Every day | ORAL | 0 refills | Status: AC

## 2023-01-19 NOTE — ED Provider Notes (Signed)
[name] Provider Note   CSN: [id number] Arrival date & time: 01/19/23  1259     History  Chief Complaint  Patient presents with   Abdominal Pain   Vomiting    Darlene Nguyen is a 22 y.o. female.  Patient complains of epigastric pain.  She states it severe and radiating to her back like it did when she got had a gallbladder removed.  Some nausea  The history is provided by the patient and medical records. No language interpreter was used.  Abdominal Pain Pain location:  Epigastric Pain quality: aching   Pain radiates to:  Does not radiate Pain severity:  Moderate Onset quality:  Sudden Timing:  Constant Progression:  Worsening Chronicity:  New Context: not alcohol use   Relieved by:  Nothing Worsened by:  Nothing Ineffective treatments:  None tried Associated symptoms: no chest pain, no cough, no diarrhea, no fatigue and no hematuria        Home Medications Prior to Admission medications   Not on File      Allergies    Patient has no known allergies.    Review of Systems   Review of Systems  Constitutional:  Negative for appetite change and fatigue.  HENT:  Negative for congestion, ear discharge and sinus pressure.   Eyes:  Negative for discharge.  Respiratory:  Negative for cough.   Cardiovascular:  Negative for chest pain.  Gastrointestinal:  Positive for abdominal pain. Negative for diarrhea.  Genitourinary:  Negative for frequency and hematuria.  Musculoskeletal:  Negative for back pain.  Skin:  Negative for rash.  Neurological:  Negative for seizures and headaches.  Psychiatric/Behavioral:  Negative for hallucinations.     Physical Exam Updated Vital Signs BP 133/84 (BP Location: Left Arm)   Pulse 70   Temp 98.2 F (36.8 C) (Oral)   Resp 18   Ht 5\' 1"  (1.549 m)   Wt 75.2 kg   LMP 12/20/2022 (Approximate)   SpO2 98%   BMI 31.32 kg/m  Physical Exam Vitals and nursing note reviewed.   Constitutional:      Appearance: She is well-developed.  HENT:     Head: Normocephalic.     Nose: Nose normal.  Eyes:     General: No scleral icterus.    Conjunctiva/sclera: Conjunctivae normal.  Neck:     Thyroid: No thyromegaly.  Cardiovascular:     Rate and Rhythm: Normal rate and regular rhythm.     Heart sounds: No murmur heard.    No friction rub. No gallop.  Pulmonary:     Breath sounds: No stridor. No wheezing or rales.  Chest:     Chest wall: No tenderness.  Abdominal:     General: There is no distension.     Tenderness: There is abdominal tenderness. There is no rebound.  Musculoskeletal:        General: Normal range of motion.     Cervical back: Neck supple.  Lymphadenopathy:     Cervical: No cervical adenopathy.  Skin:    Findings: No erythema or rash.  Neurological:     Mental Status: She is alert and oriented to person, place, and time.     Motor: No abnormal muscle tone.     Coordination: Coordination normal.  Psychiatric:        Behavior: Behavior normal.     ED Results / Procedures / Treatments   Labs (all labs ordered are listed, but only abnormal results are  displayed) Labs Reviewed  COMPREHENSIVE METABOLIC PANEL - Abnormal; Notable for the following components:      Result Value   Potassium 3.4 (*)    Glucose, Bld 118 (*)    Total Protein 8.2 (*)    All other components within normal limits  CBC - Abnormal; Notable for the following components:   RBC 5.30 (*)    Hemoglobin 11.8 (*)    MCV 69.2 (*)    MCH 22.3 (*)    All other components within normal limits  LIPASE, BLOOD  HCG, SERUM, QUALITATIVE  URINALYSIS, ROUTINE W REFLEX MICROSCOPIC    EKG None  Radiology No results found.  Procedures Procedures    Medications Ordered in ED Medications  pantoprazole (PROTONIX) injection 40 mg (has no administration in time range)  ondansetron (ZOFRAN) injection 4 mg (has no administration in time range)  ondansetron (ZOFRAN-ODT)  disintegrating tablet 4 mg (4 mg Oral Given 01/19/23 1321)  HYDROmorphone (DILAUDID) injection 0.5 mg (0.5 mg Intravenous Given 01/19/23 1424)  LORazepam (ATIVAN) injection 0.5 mg (0.5 mg Intravenous Given 01/19/23 1423)    ED Course/ Medical Decision Making/ A&P Clinical Course as of 01/19/23 1653  Wed Jan 19, 2023  1641 CT Renal Stone Study IMPRESSION: 1. No acute abdominopelvic process. 2. Retained gallstone at the gallbladder fossa, suspected within the residual cystic duct on prior ultrasound. No intra-or extrahepatic biliary duct ductal dilatation.   [TY]    Clinical Course User Index [TY] Coral Spikes, DO  Patient with severe epigastric and back pain.  Worse with palpitations.  Labs unremarkable.                               Medical Decision Making Amount and/or Complexity of Data Reviewed Labs: ordered. Radiology: ordered. Decision-making details documented in ED Course.  Risk Prescription drug management.   Epigastric abdominal pain.  Disposition will be determined by my colleague        Final Clinical Impression(s) / ED Diagnoses Final diagnoses:  None    Rx / DC Orders ED Discharge Orders     None         Bethann Berkshire, MD 01/19/23 1654

## 2023-01-19 NOTE — Discharge Instructions (Addendum)
Follow-up with your family doctor next week and also you have been referred to Dr. Ewing Schlein the gastroenterologist for follow-up in the next few weeks

## 2023-01-19 NOTE — ED Provider Notes (Signed)
22 year old female present emergency department for abdominal pain nausea vomiting.  Signed out with pending CT scan and reevaluation.  On reevaluation patient is still continuing of abdominal pain, nausea, but no vomiting.  Physical Exam  BP (!) 141/91 (BP Location: Left Arm)   Pulse (!) 59   Temp 98.6 F (37 C) (Oral)   Resp 18   Ht 5\' 1"  (1.549 m)   Wt 75.2 kg   LMP 12/20/2022 (Approximate)   SpO2 100%   BMI 31.32 kg/m   Physical Exam Vitals reviewed.  HENT:     Head: Normocephalic.  Cardiovascular:     Rate and Rhythm: Normal rate and regular rhythm.  Abdominal:     Palpations: Abdomen is soft.     Tenderness: There is no abdominal tenderness.  Skin:    General: Skin is warm and dry.     Capillary Refill: Capillary refill takes less than 2 seconds.  Neurological:     General: No focal deficit present.     Mental Status: She is alert.  Psychiatric:     Comments: On all fours on the bed, moaning.     Procedures  Procedures  ED Course / MDM   Clinical Course as of 01/19/23 1934  Wed Jan 19, 2023  1641 CT Renal Stone Study IMPRESSION: 1. No acute abdominopelvic process. 2. Retained gallstone at the gallbladder fossa, suspected within the residual cystic duct on prior ultrasound. No intra-or extrahepatic biliary duct ductal dilatation.   [TY]  1656 Chart review in January 2024  per d/c summary: "22 y.o. female with medical history significant of class I obesity, cannabis use, history of cholecystectomy with residual choledocholithiasis who presented to the emergency department with abdominal and back pain associated with numerous episodes of emesis." [TY]  1708 Patient is on all fours on the bed.  Moaning reporting of crampy abdominal pain and nausea.  No vomiting.  Endorsed marijuana use yesterday symptoms started at 3 AM.  Highly suspicious for cannabinoid hyperemesis syndrome.  Will treat with droperidol and reevaluate. [TY]    Clinical Course User Index [TY]  Coral Spikes, DO   Medical Decision Making Well-appearing 22 year old female presenting emergency department for abdominal pain.  Seen primarily by the morning doctor.  See their note for full HPI.  Signed out pending CT scan.  CT scan without acute pathology to explain patient's symptoms.  Reviewed labs.  No significant metabolic derangements.  Normal liver enzymes.  Lipase normal.  Pancreatitis unlikely.  No leukocytosis to suggest systemic infection.  Pregnancy test negative.  Ectopic pregnancy unlikely.  Patient did endorse THC use yesterday.  Question cannabinoid hyperemesis.  Given droperidol, GI cocktail and Percocet.  Symptoms controlled.  She is tolerating p.o. here in the emergency department.  Will discharge in stable condition.  Amount and/or Complexity of Data Reviewed Labs: ordered. Radiology: ordered. Decision-making details documented in ED Course.  Risk OTC drugs. Prescription drug management.         Coral Spikes, DO 01/19/23 Barry Brunner

## 2023-01-19 NOTE — ED Triage Notes (Signed)
Pt arrived with EMS reporting abdominal pain right side that started at 0300. Pain radiates to the lower back. Pt states pain feels like it did when she had her gallbladder removed. Denies any other symptoms.

## 2023-02-01 ENCOUNTER — Telehealth: Payer: Self-pay | Admitting: *Deleted

## 2023-02-01 NOTE — Progress Notes (Signed)
Transition Care Management Unsuccessful Follow-up Telephone Call  Date of discharge and from where:  [facility]  01/19/2023  Attempts:  1st Attempt  Reason for unsuccessful TCM follow-up call:  No answer/busy

## 2023-02-04 ENCOUNTER — Telehealth: Payer: Self-pay | Admitting: *Deleted

## 2023-02-04 NOTE — Progress Notes (Signed)
 Transition Care Management Unsuccessful Follow-up Telephone Call  Date of discharge and from where:  [facility]  01/19/2023  Attempts:  2nd Attempt  Reason for unsuccessful TCM follow-up call:  Unable to leave message

## 2023-03-05 IMAGING — US US PELVIS COMPLETE TRANSABD/TRANSVAG W DUPLEX AND/OR DOPPLER
1 series · 13 of 25 positions shown · non-contrast
Comparison: CT earlier today

CLINICAL DATA: Pain.

EXAM:
TRANSABDOMINAL AND TRANSVAGINAL ULTRASOUND OF PELVIS
DOPPLER ULTRASOUND OF OVARIES
TECHNIQUE: Both transabdominal and transvaginal ultrasound examinations of the
pelvis were performed. Transabdominal technique was performed for
global imaging of the pelvis including uterus, ovaries, adnexal
regions, and pelvic cul-de-sac.
It was necessary to proceed with endovaginal exam following the
transabdominal exam to visualize the ovaries and adnexa. Color and
duplex Doppler ultrasound was utilized to evaluate blood flow to the
ovaries.

[Series 1: us pelvic complete w transvaginal and torsion righ · 13 of 62 slices shown]
[im 1/62]
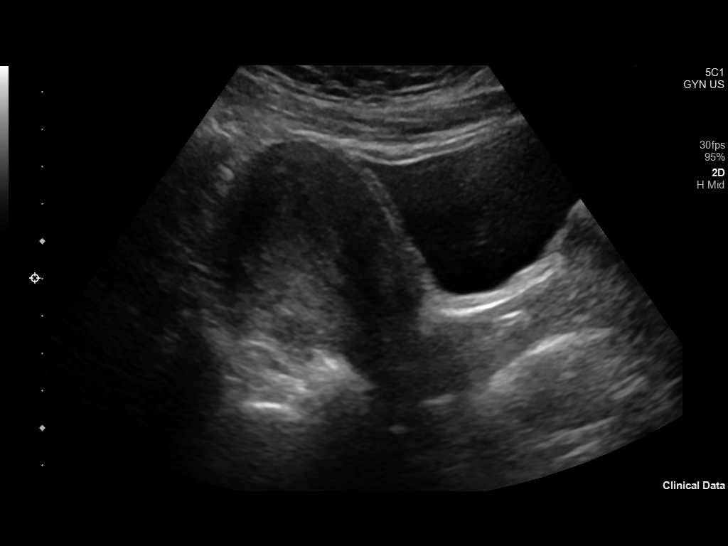
[im 6/62]
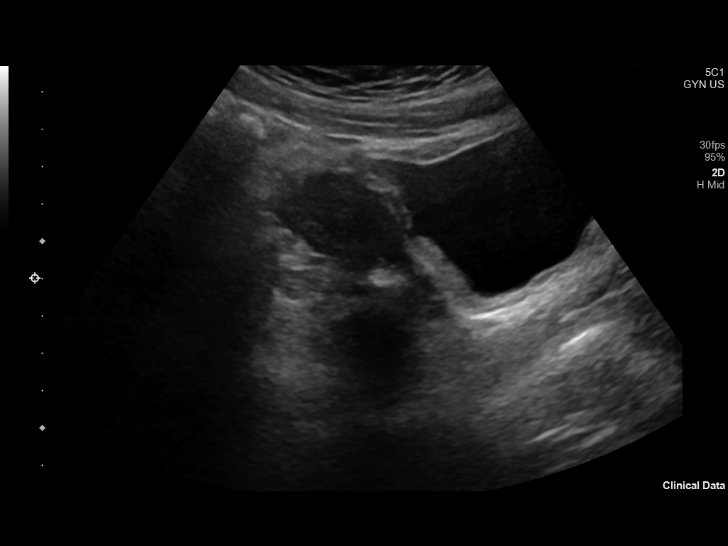
[im 11/62]
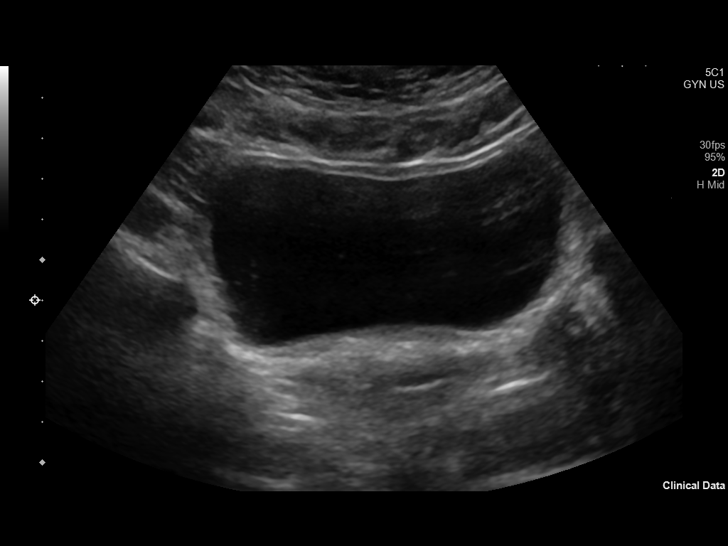
[im 16/62]
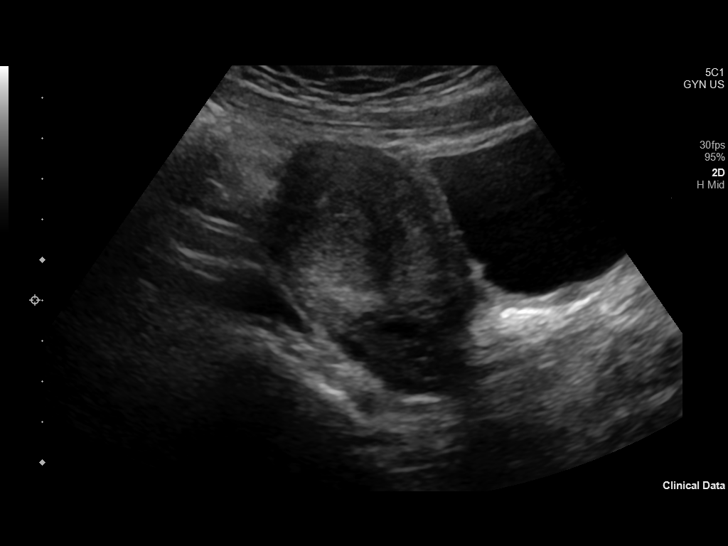
[im 21/62]
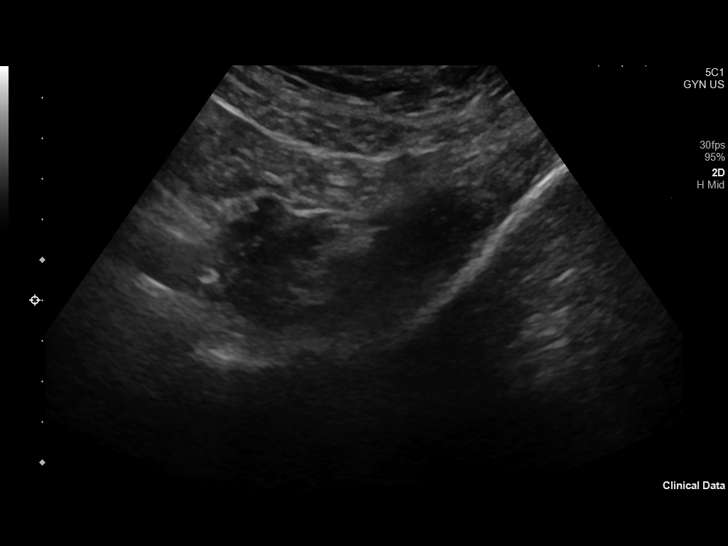
[im 26/62]
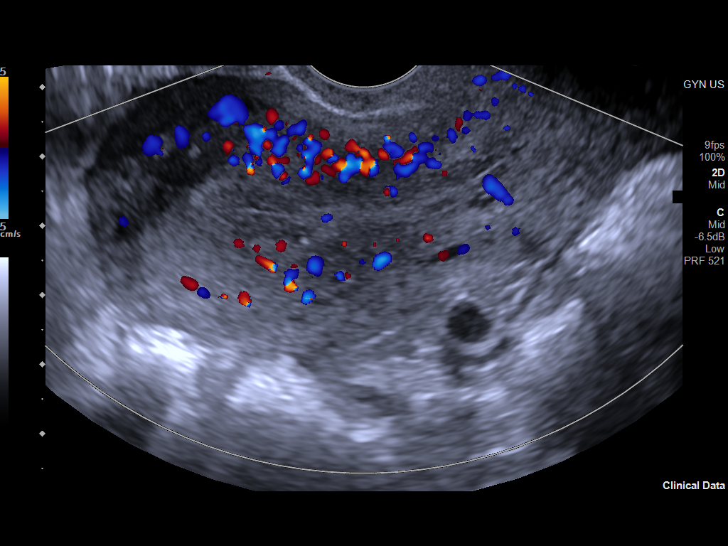
[im 31/62]
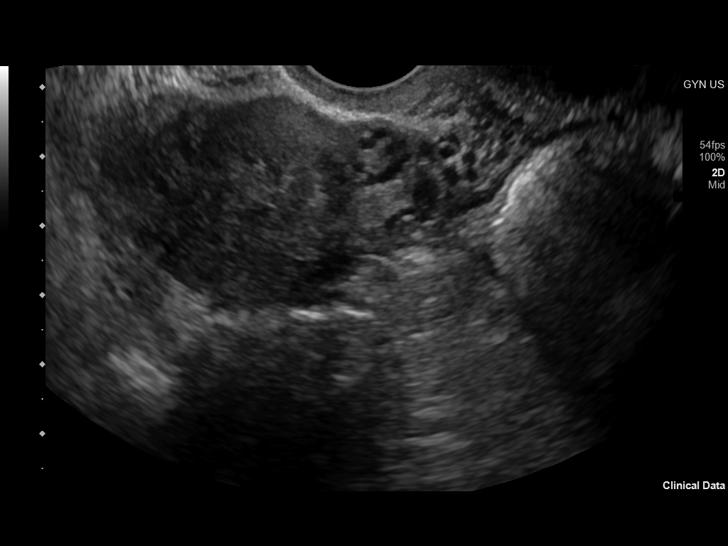
[im 36/62]
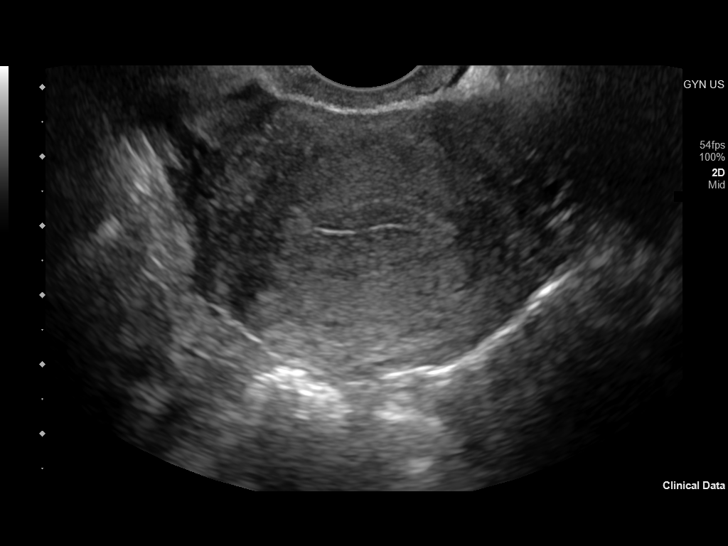
[im 41/62]
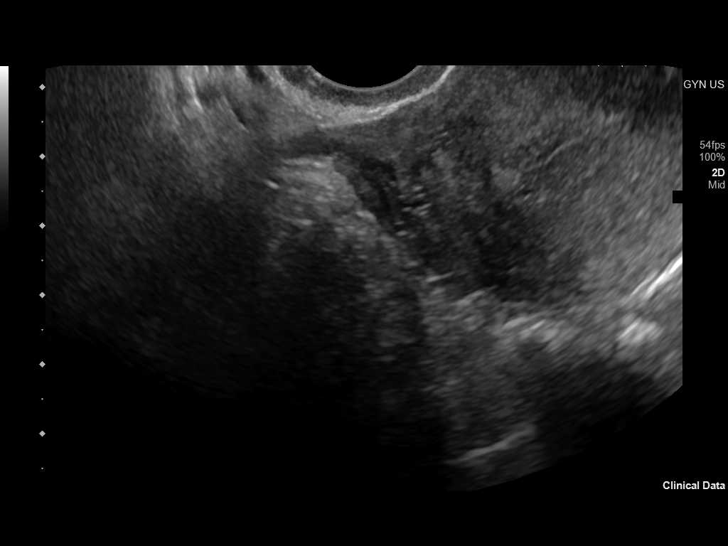
[im 46/62]
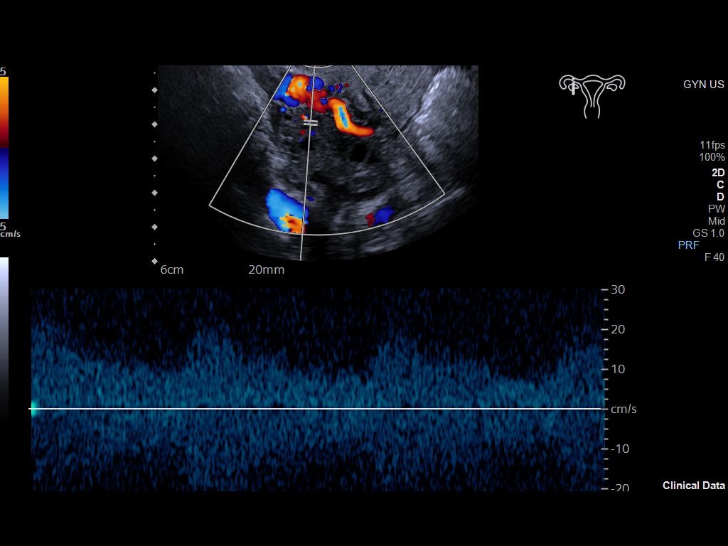
[im 51/62]
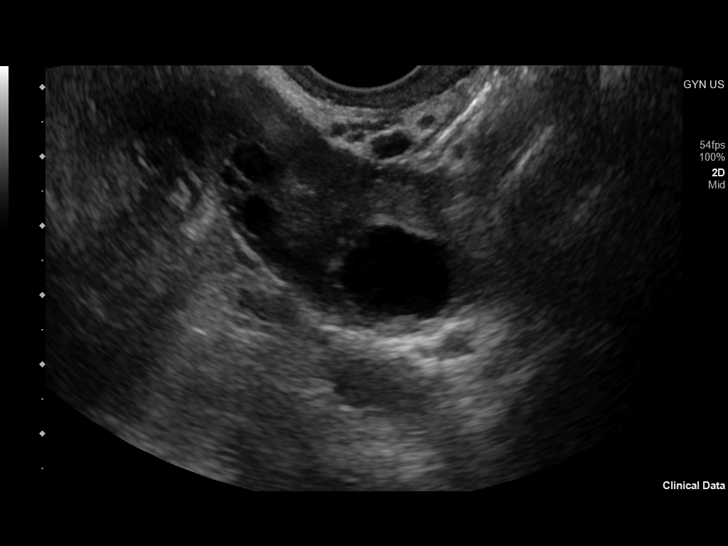
[im 56/62]
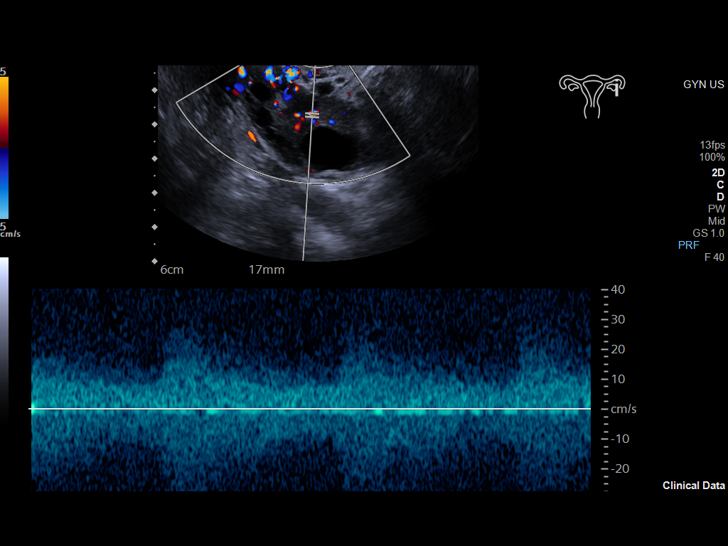
[im 62/62]
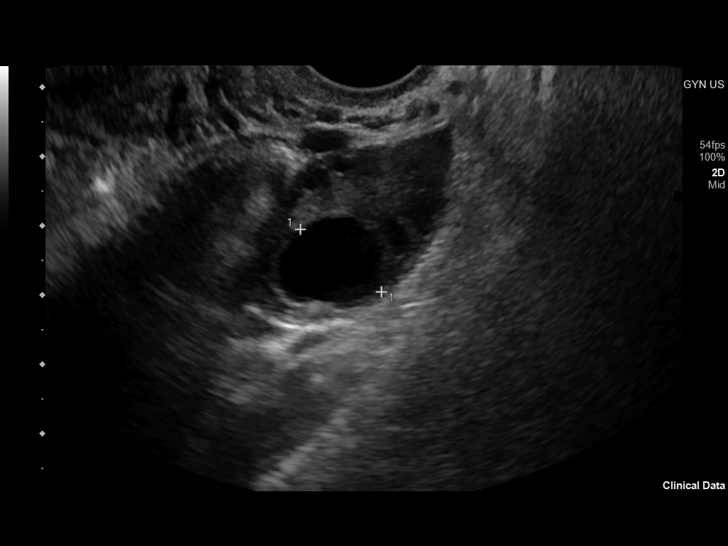

[13 of 25 positions shown; findings below may reference images not displayed]

FINDINGS: Uterus

Measurements: 9.1 x 4.2 x 5.1 cm = volume: 101 mL. The uterus is
anteverted. No fibroid or focal abnormality.

Endometrium

Thickness: 11.3 mm.  No focal abnormality visualized.

Right ovary

Measurements: 4.1 x 2 x 1.9 cm = volume: 8.2 mL. Normal appearance
with physiologic follicles. Normal ovarian blood flow. No cyst or
solid lesion. No adnexal mass.

Left ovary

Measurements: 4.6 x 2.2 x 2.1 cm = volume: 11 mL. Normal appearance
with physiologic follicles and follicular cyst measuring 1.6 cm.
This is physiologic. Normal ovarian blood flow. No solid lesion. No
adnexal mass.

Pulsed Doppler evaluation of both ovaries demonstrates normal
low-resistance arterial and venous waveforms.

Other findings

Trace free fluid, physiologic.
IMPRESSION: Unremarkable pelvic ultrasound. Normal blood flow to both ovaries
without torsion.

## 2023-03-05 IMAGING — CT CT ABD-PELV W/ CM
2 of 4 series · 16 of 46 positions shown, 18 images · IV contrast (APPLIED)
Comparison: None.

CLINICAL DATA: Abdominal pain

EXAM:
CT ABDOMEN AND PELVIS WITH CONTRAST
TECHNIQUE: Multidetector CT imaging of the abdomen and pelvis was performed
using the standard protocol following bolus administration of
intravenous contrast.

[Series 2: abdomen 5.0 · axial · 0.72mm/px · z∈[-998,-608]mm · 13 of 88 slices shown, 15 images]
[im 5/88  soft-tissue]
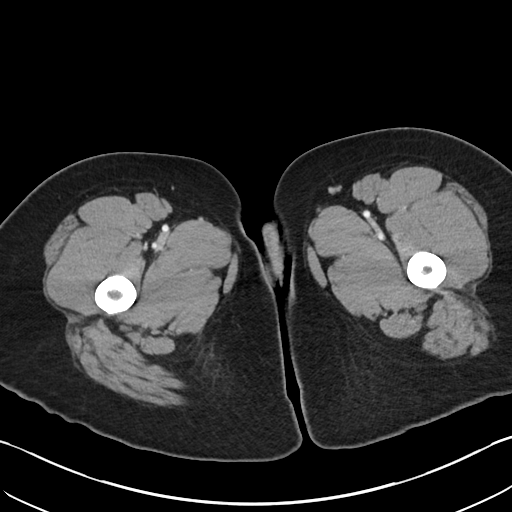
[im 5/88  bone]
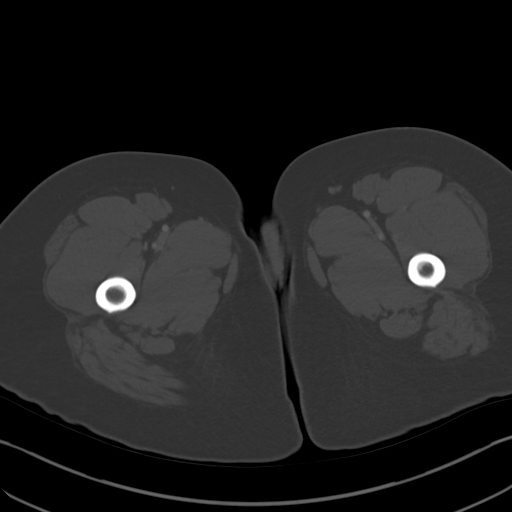
[im 14/88  soft-tissue]
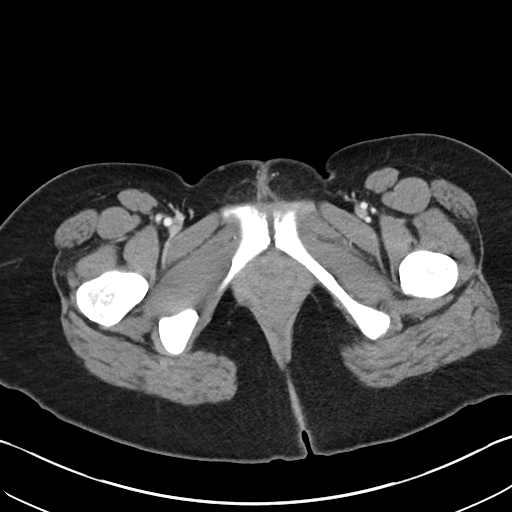
[im 19/88  soft-tissue]
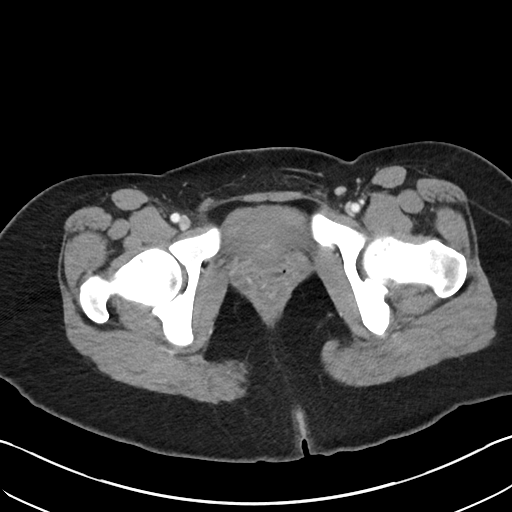
[im 23/88  soft-tissue]
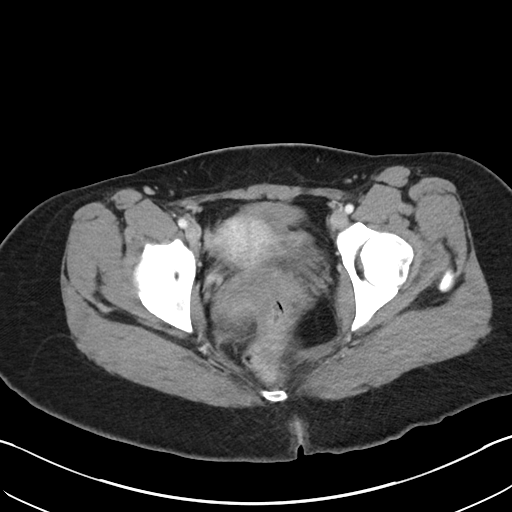
[im 33/88  soft-tissue]
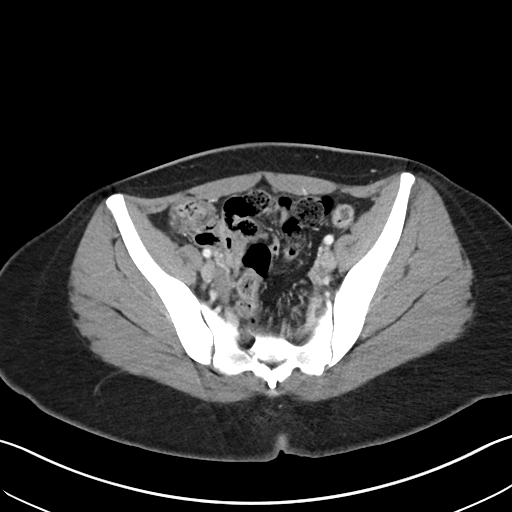
[im 37/88  soft-tissue]
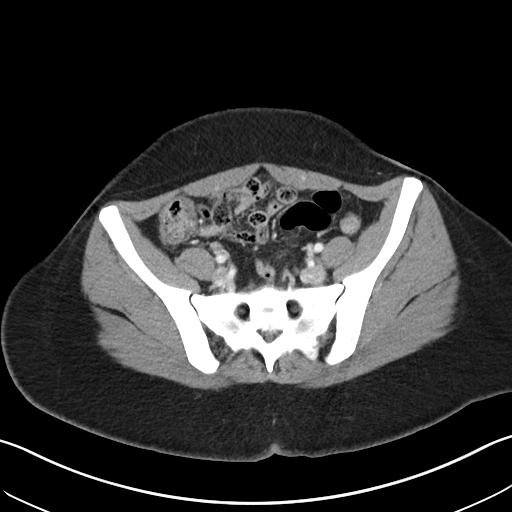
[im 46/88  soft-tissue]
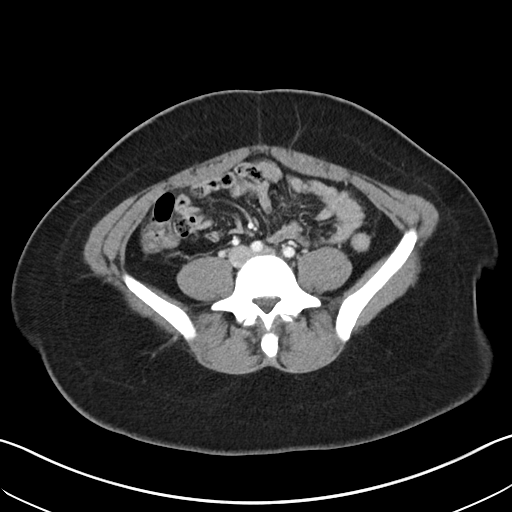
[im 51/88  soft-tissue]
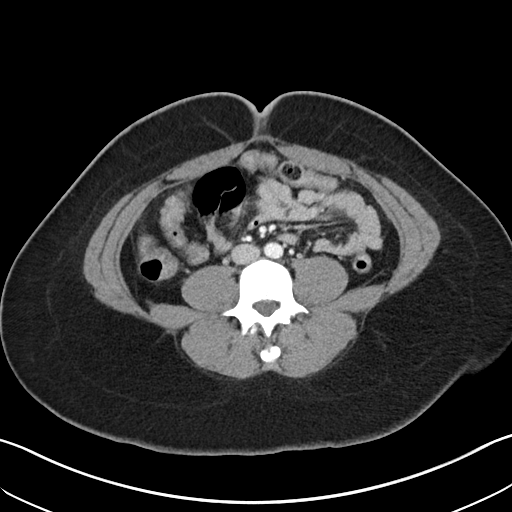
[im 55/88  soft-tissue]
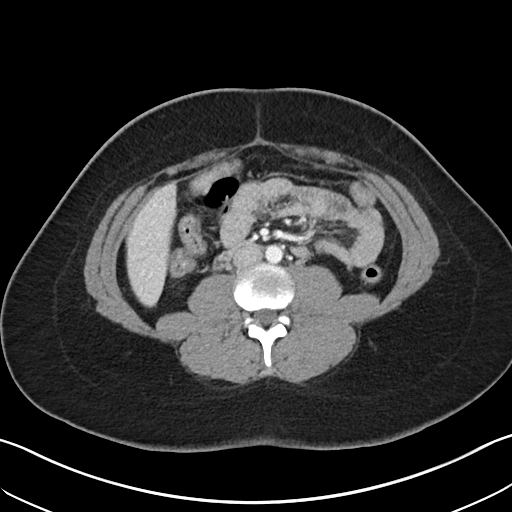
[im 55/88  bone]
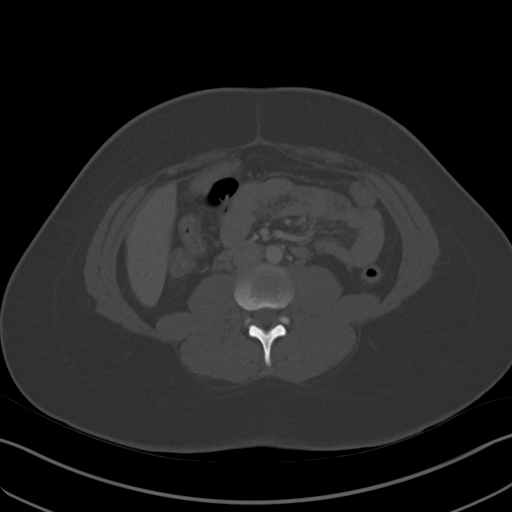
[im 65/88  soft-tissue]
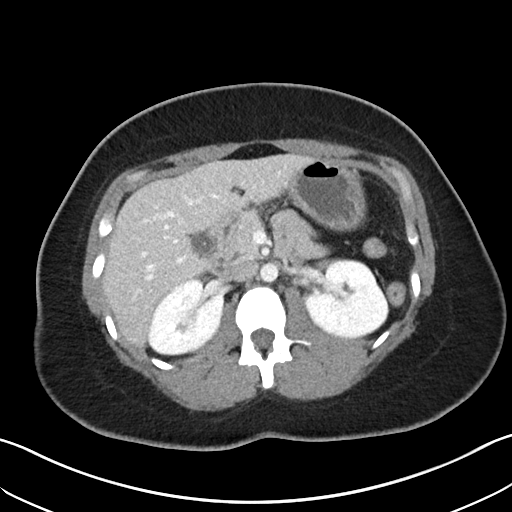
[im 69/88  soft-tissue]
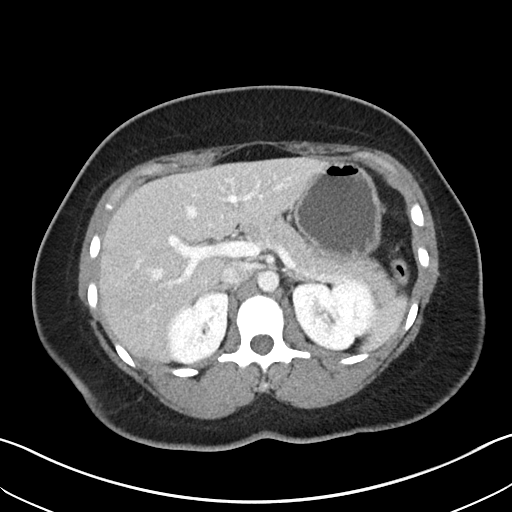
[im 74/88  soft-tissue]
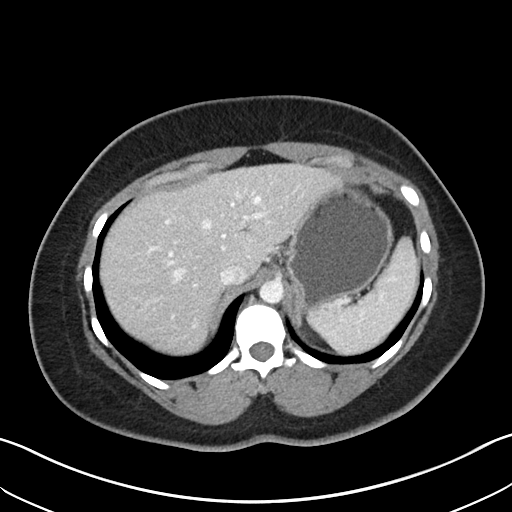
[im 83/88  soft-tissue]
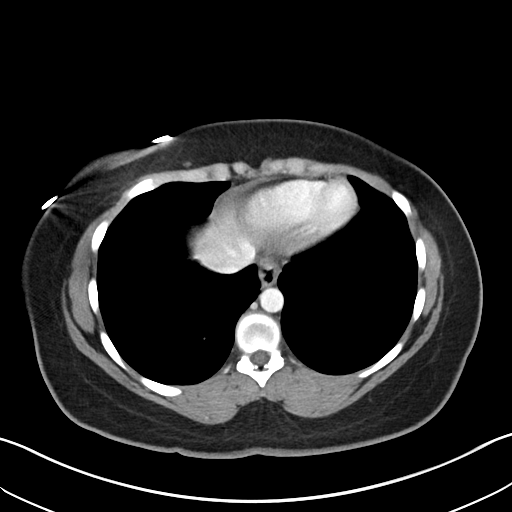

[Series 5: abdomen 3.0 mpr cor · coronal · 0.88mm/px · 3 of 94 slices shown]
[im 32/94  soft-tissue]
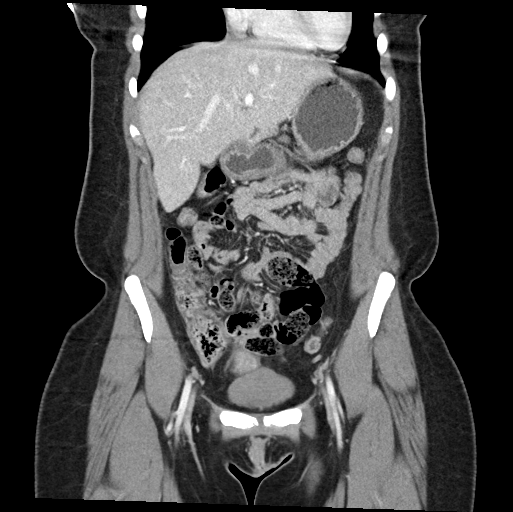
[im 42/94  soft-tissue]
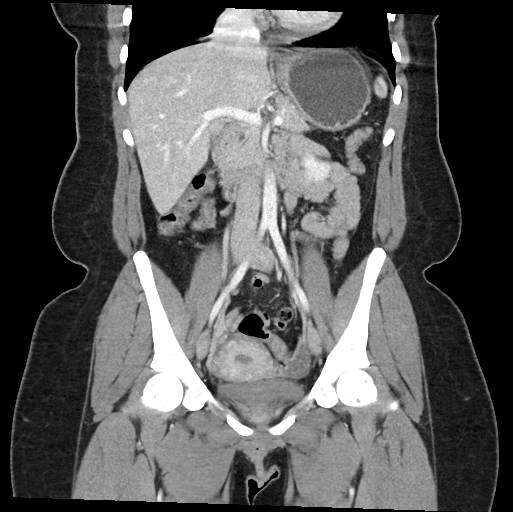
[im 52/94  soft-tissue]
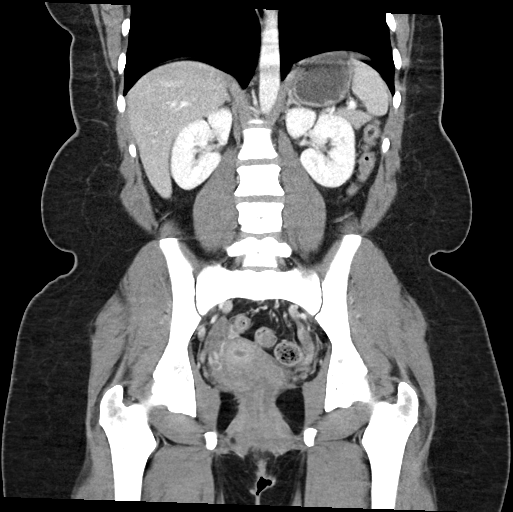

[16 of 46 positions shown; findings below may reference images not displayed]

RADIATION DOSE REDUCTION: This exam was performed according to the
departmental dose-optimization program which includes automated
exposure control, adjustment of the mA and/or kV according to
patient size and/or use of iterative reconstruction technique.

CONTRAST:  100mL OMNIPAQUE IOHEXOL 300 MG/ML  SOLN
FINDINGS: Lower chest: Motion artifacts limit evaluation. As far as seen,
there is no focal consolidation in the lower lung fields.

Hepatobiliary: No significant focal abnormality is seen in the
liver. Small ill-defined focus of decreased density in the liver
close to the falciform ligament may suggest aberrant venous
drainage. There is no dilation of bile ducts. Gallbladder is not
distended. Small calcific densities seen in the fundus of the
gallbladder. There is no fluid around the gallbladder.

Pancreas: No focal abnormality is seen.

Spleen: Unremarkable.

Adrenals/Urinary Tract: Adrenals are not enlarged. There is no
hydronephrosis. There are no renal or ureteral stones. Urinary
bladder is not distended.

Stomach/Bowel: Stomach is moderately distended with fluid. Small
bowel loops are not dilated. Appendix is not distinctly seen. There
is no focal pericecal inflammation. There is mild wall thickening in
the right colon. There is no pericolic stranding.

Vascular/Lymphatic: Unremarkable.

Reproductive: Small amount of free fluid is seen in the pelvis.
There are small low-density foci in the left adnexa each measuring
less than 2.5 cm, possibly follicles.

Other: There is no pneumoperitoneum. Small umbilical hernia
containing fat is seen.

Musculoskeletal: Unremarkable.
IMPRESSION: There is no evidence of intestinal obstruction or pneumoperitoneum.
There is no hydronephrosis.

There is mild diffuse wall thickening in the right colon which may
be due to incomplete distention or suggest nonspecific colitis.

There are few small calcific densities in gallbladder suggesting
gallbladder stones and possibly calcification in the wall of
gallbladder.

Small amount of free fluid in the pelvis may suggest recent rupture
of ovarian cyst or follicle.

Other findings as described in the body of the report.

## 2023-03-05 IMAGING — US US ABDOMEN LIMITED
1 series · 14 of 25 positions shown · non-contrast
Comparison: CT abdomen pelvis from same day.

CLINICAL DATA: Right upper quadrant pain.

EXAM:
ULTRASOUND ABDOMEN LIMITED RIGHT UPPER QUADRANT

[Series 1: us abdomen limited ruq (liver/gb) · 39 acquisitions, 14 frames shown]
[im 1/39]
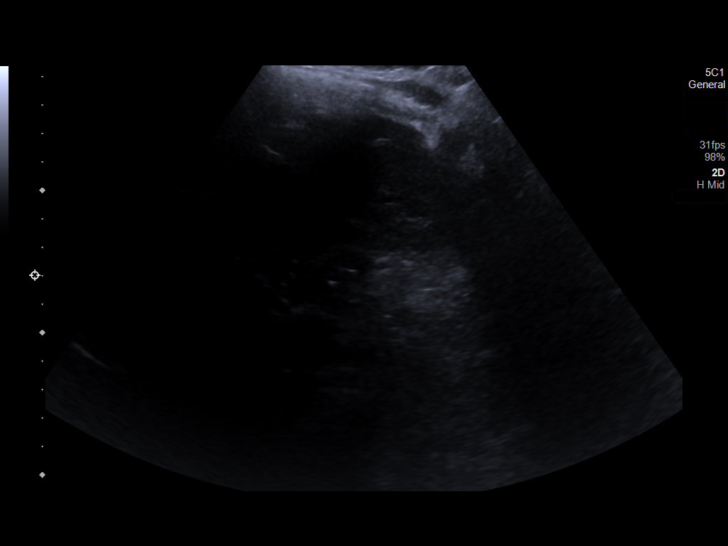
[im 4/39]
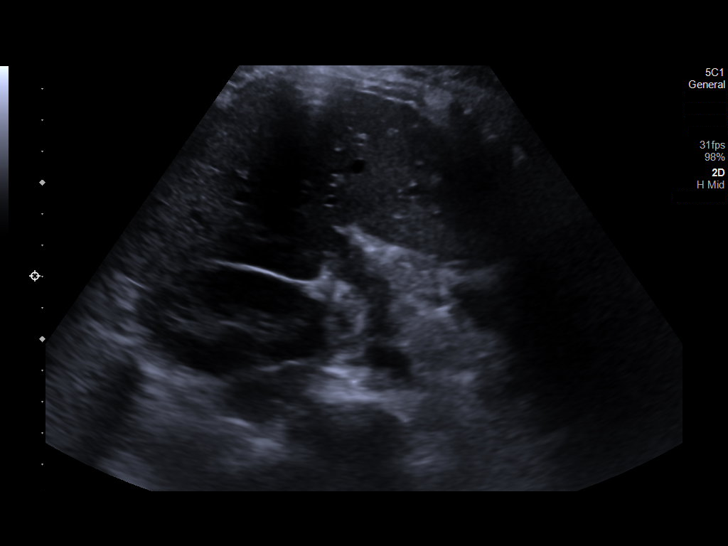
[im 7/39]
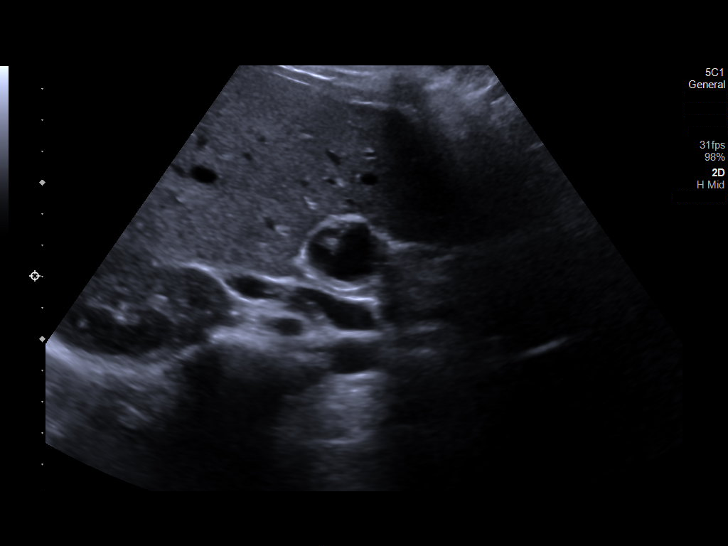
[im 10/39]
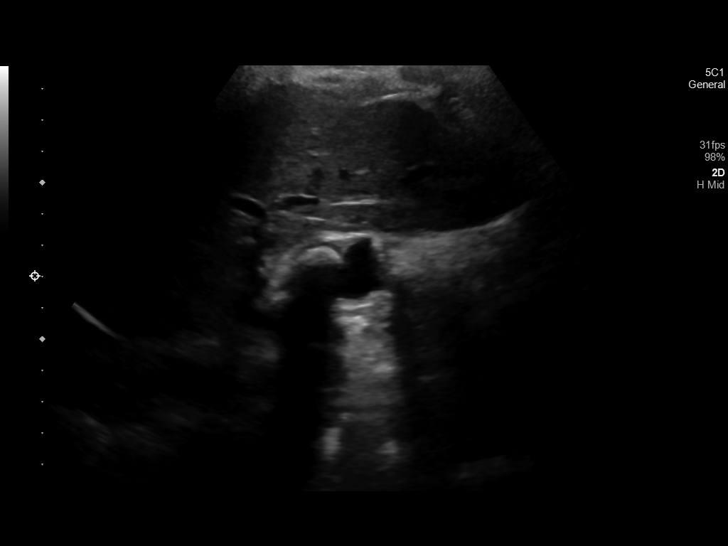
[im 13/39]
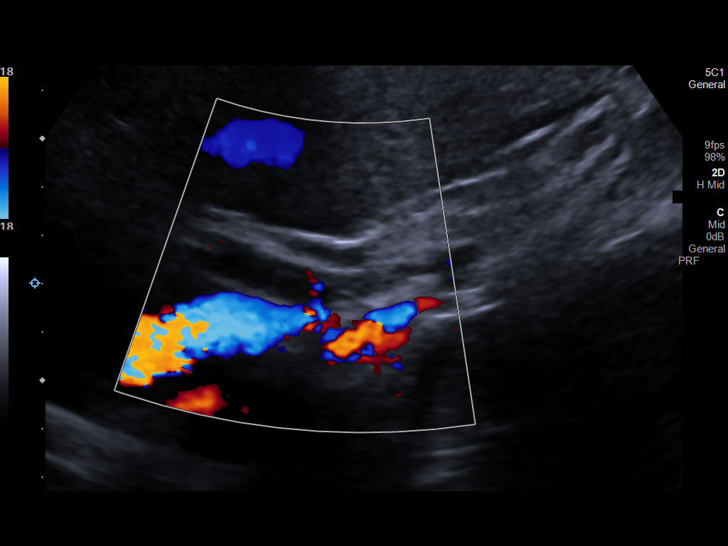
[im 15/39]
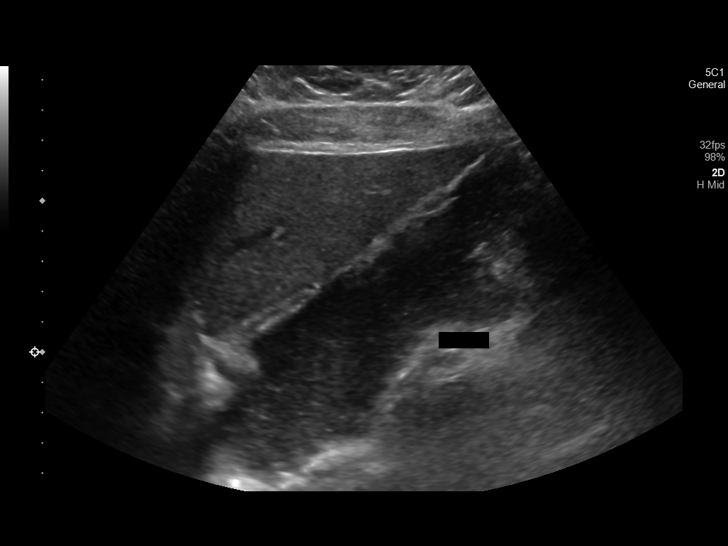
[im 18/39]
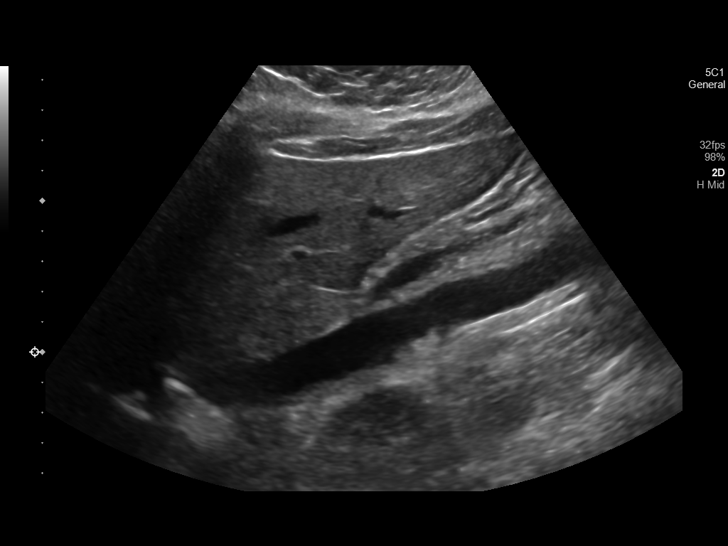
[im 21/39]
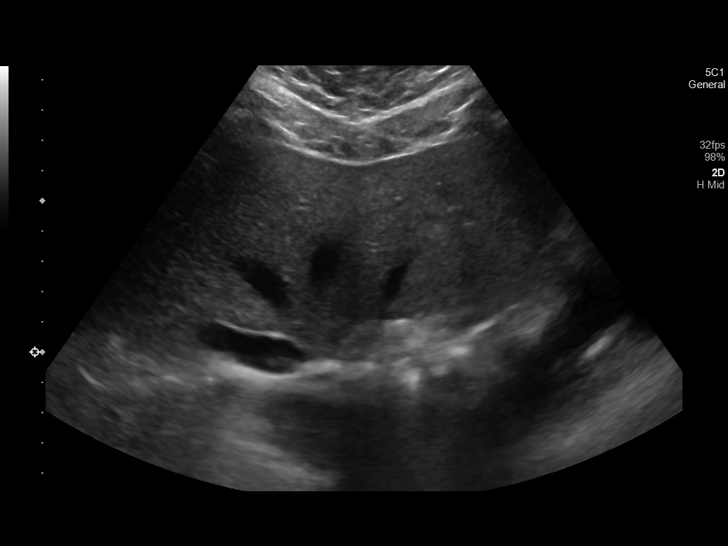
[im 24/39]
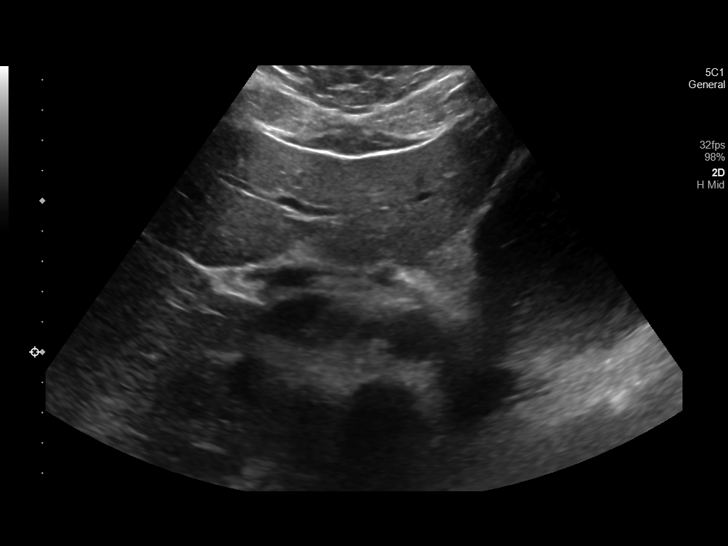
[im 26/39]
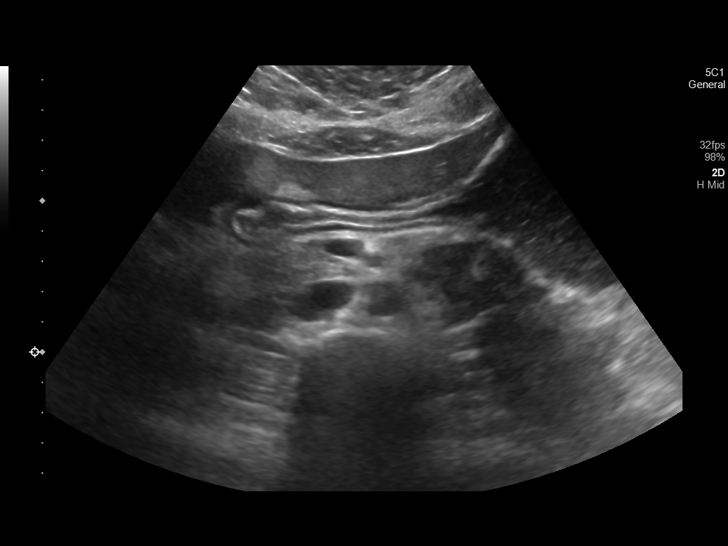
[im 29/39]
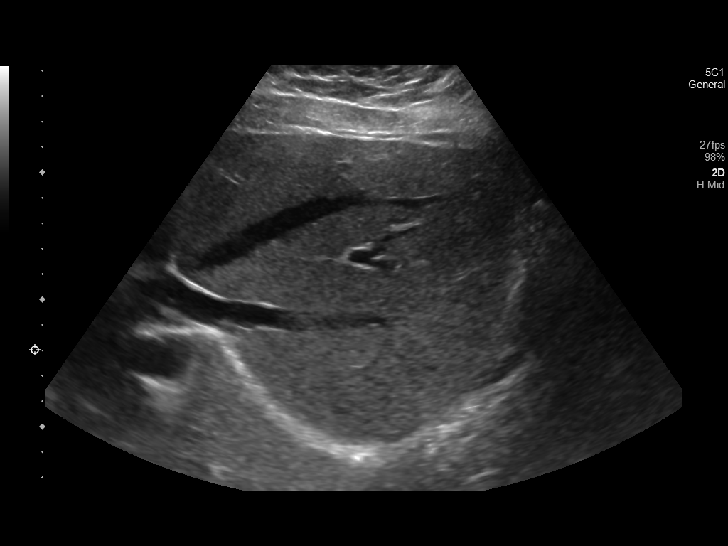
[im 32/39]
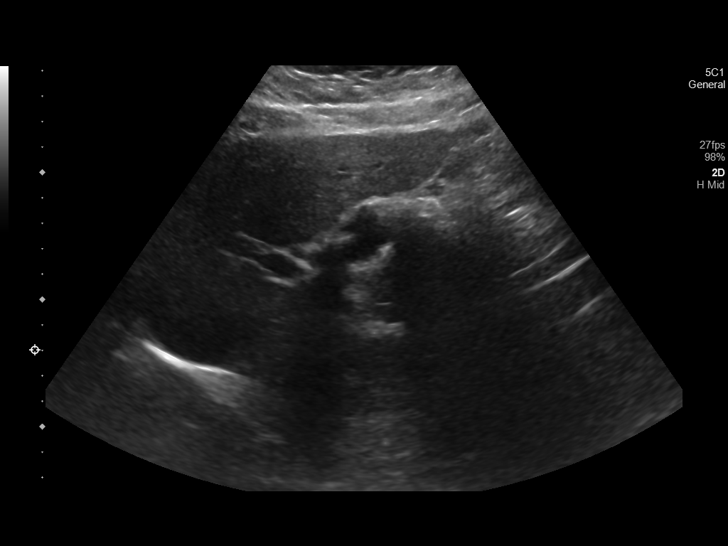
[im 35/39]
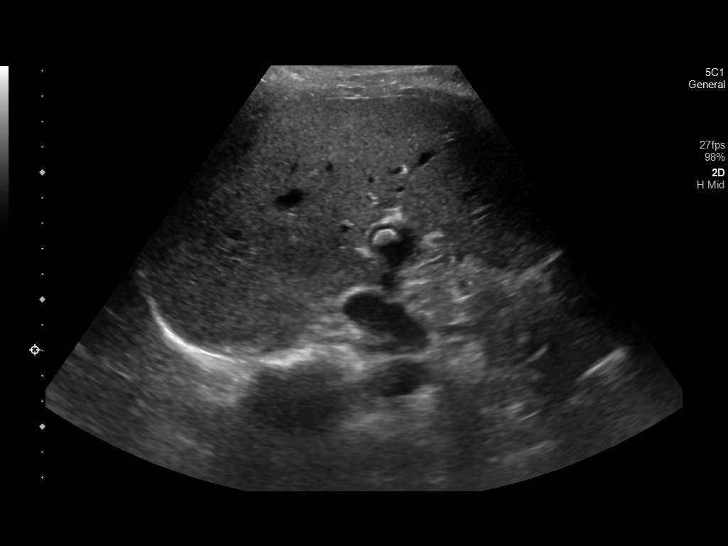
[im 39/39]
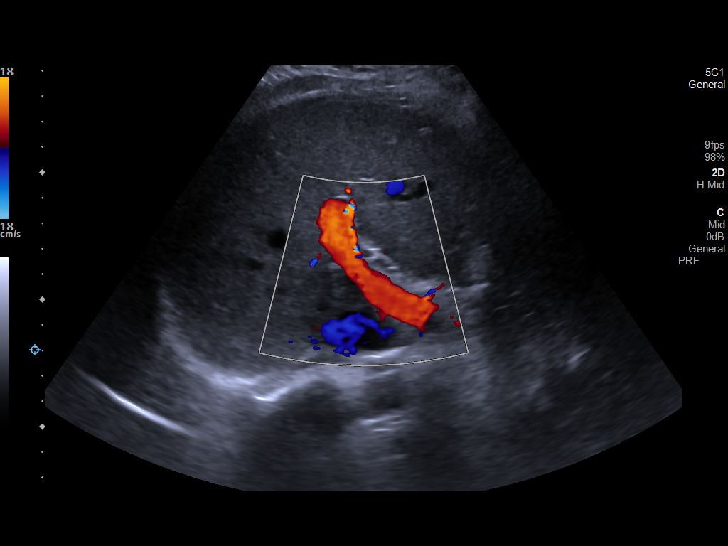

[14 of 25 positions shown; findings below may reference images not displayed]

FINDINGS: Gallbladder:

Surgically absent. There is a 1.2 cm shadowing calculus within a
prominent remnant cystic duct, without wall thickening.

Common bile duct:

Diameter: 4 mm, normal.

Liver:

No focal lesion identified. Within normal limits in parenchymal
echogenicity. Portal vein is patent on color Doppler imaging with
normal direction of blood flow towards the liver.

Other: None.
IMPRESSION: 1. Prior cholecystectomy. 1.2 cm gallstone within a prominent
remnant cystic duct.
